# Patient Record
Sex: Female | Born: 1955 | Race: Black or African American | Hispanic: No | Smoking: Never smoker
Health system: Southern US, Community
[De-identification: ages and names within clinical notes are randomized; demographics above are authoritative.]

## PROBLEM LIST (undated history)

## (undated) DIAGNOSIS — K859 Acute pancreatitis without necrosis or infection, unspecified: Secondary | ICD-10-CM

## (undated) DIAGNOSIS — I10 Essential (primary) hypertension: Secondary | ICD-10-CM

## (undated) DIAGNOSIS — K8020 Calculus of gallbladder without cholecystitis without obstruction: Secondary | ICD-10-CM

## (undated) DIAGNOSIS — M1990 Unspecified osteoarthritis, unspecified site: Secondary | ICD-10-CM

## (undated) DIAGNOSIS — I519 Heart disease, unspecified: Secondary | ICD-10-CM

## (undated) HISTORY — DX: Calculus of gallbladder without cholecystitis without obstruction: K80.20

## (undated) HISTORY — PX: JOINT REPLACEMENT: SHX530

## (undated) HISTORY — DX: Acute pancreatitis without necrosis or infection, unspecified: K85.90

## (undated) HISTORY — PX: TONSILLECTOMY: SUR1361

## (undated) HISTORY — PX: ABDOMINAL HYSTERECTOMY: SHX81

## (undated) HISTORY — PX: OTHER SURGICAL HISTORY: SHX169

## (undated) HISTORY — PX: CARDIAC CATHETERIZATION: SHX172

---

## 1997-08-03 HISTORY — PX: GASTROPLASTY VERTICAL BANDED: SUR640

## 2004-06-24 ENCOUNTER — Ambulatory Visit: Payer: Self-pay

## 2004-10-15 ENCOUNTER — Ambulatory Visit: Payer: Self-pay | Admitting: Unknown Physician Specialty

## 2004-10-23 ENCOUNTER — Ambulatory Visit: Payer: Self-pay | Admitting: Unknown Physician Specialty

## 2005-05-05 ENCOUNTER — Ambulatory Visit: Payer: Self-pay | Admitting: Unknown Physician Specialty

## 2006-07-07 ENCOUNTER — Ambulatory Visit: Payer: Self-pay | Admitting: Internal Medicine

## 2006-07-28 ENCOUNTER — Ambulatory Visit: Payer: Self-pay | Admitting: Unknown Physician Specialty

## 2006-09-07 ENCOUNTER — Other Ambulatory Visit: Payer: Self-pay

## 2006-09-08 ENCOUNTER — Inpatient Hospital Stay: Payer: Self-pay | Admitting: Internal Medicine

## 2007-01-12 ENCOUNTER — Ambulatory Visit: Payer: Self-pay | Admitting: Unknown Physician Specialty

## 2007-02-07 ENCOUNTER — Ambulatory Visit: Payer: Self-pay | Admitting: Unknown Physician Specialty

## 2007-07-22 IMAGING — CT CT NECK WITH CONTRAST
1 of 2 series · 9 of 14 positions shown, 12 images · IV contrast (agent unspecified)
Comparison: none

REASON FOR EXAM: Right submandibular gland mass, lymphadenitis
COMMENTS:

PROCEDURE:     CT  - CT NECK WITH CONTRAST  - July 28, 2006  [DATE]
RESULT:     IV contrast enhanced CT scan of neck is performed.

[Series 2: soft tissue · axial · 0.48mm/px · z∈[+204,+432]mm · 9 of 96 slices shown, 12 images]
[im 10/96  soft-tissue]
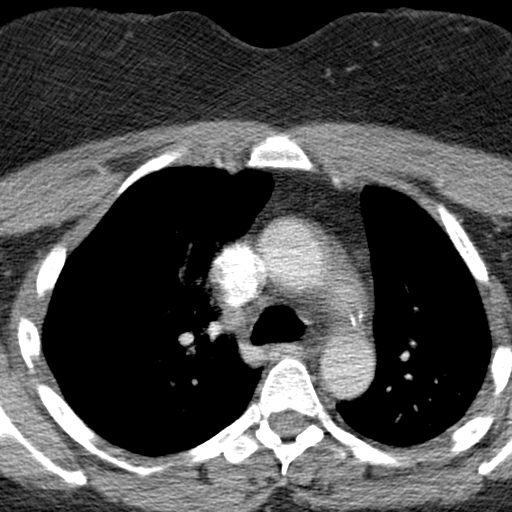
[im 10/96  bone]
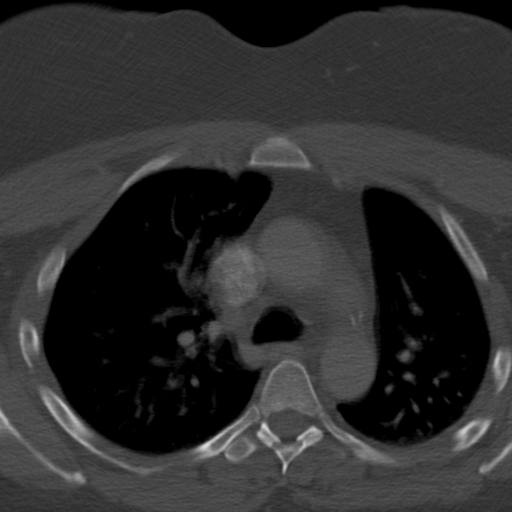
[im 20/96  bone]
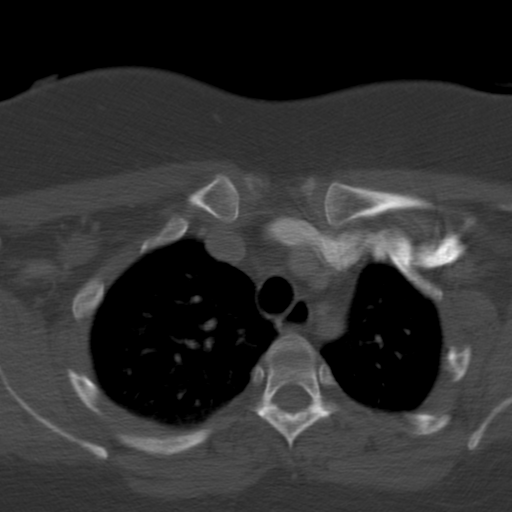
[im 29/96  bone]
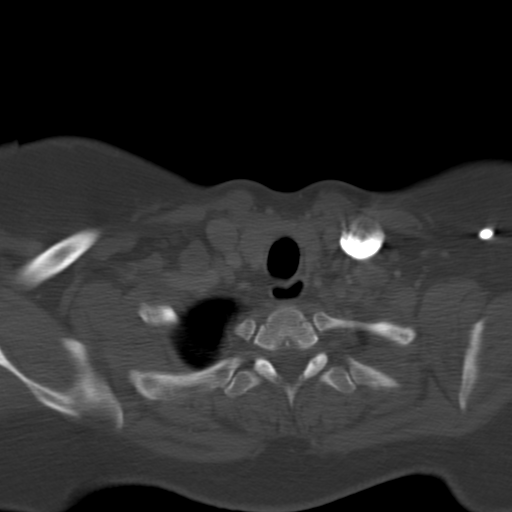
[im 39/96  bone]
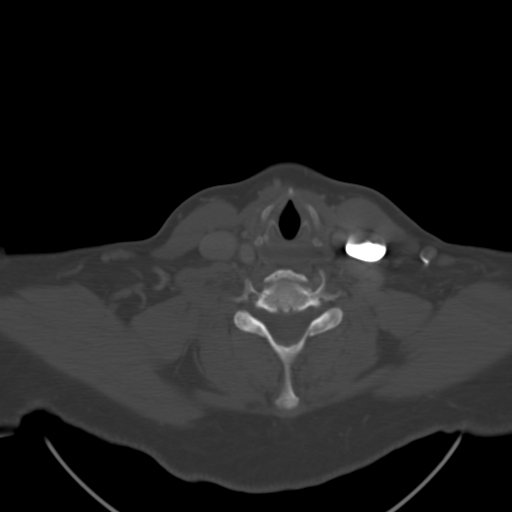
[im 48/96  soft-tissue]
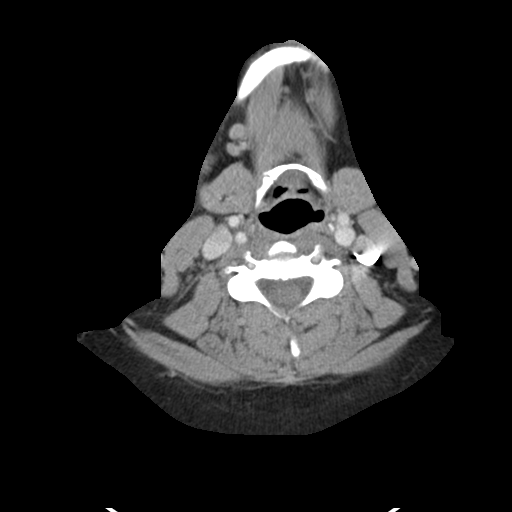
[im 48/96  bone]
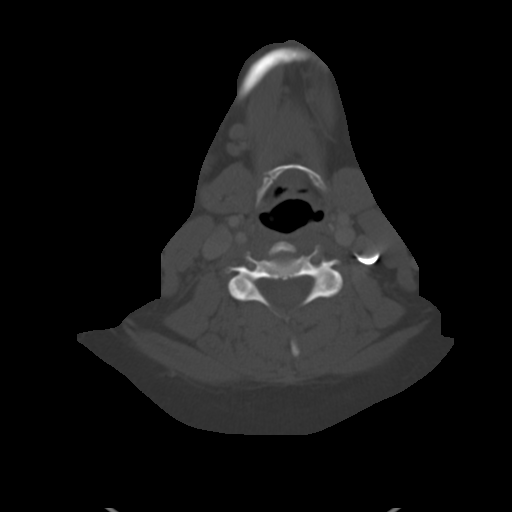
[im 58/96  bone]
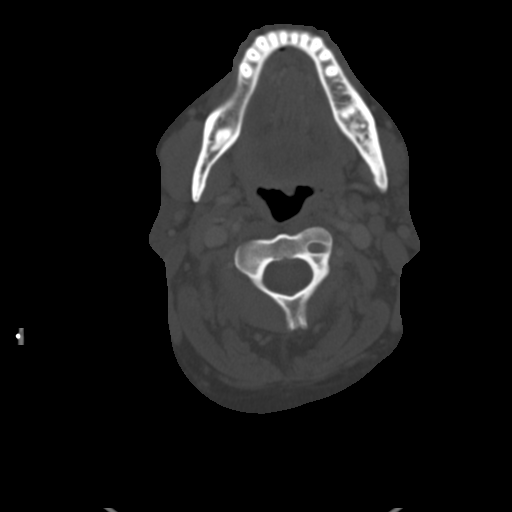
[im 67/96  bone]
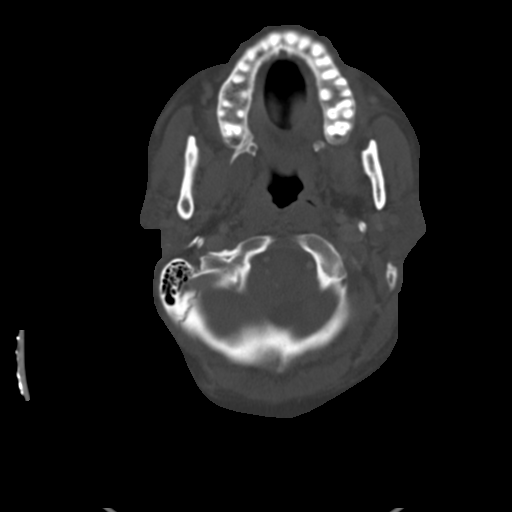
[im 77/96  bone]
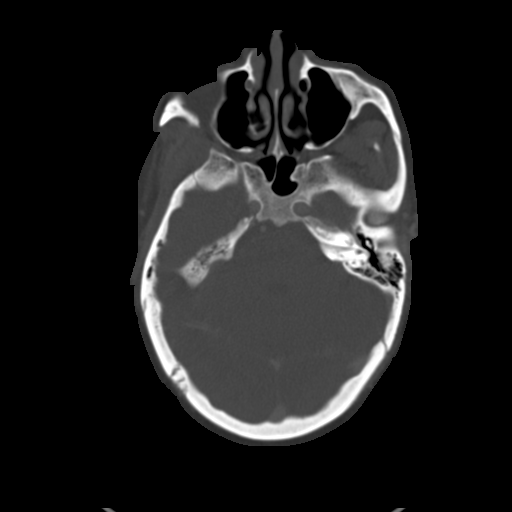
[im 86/96  soft-tissue]
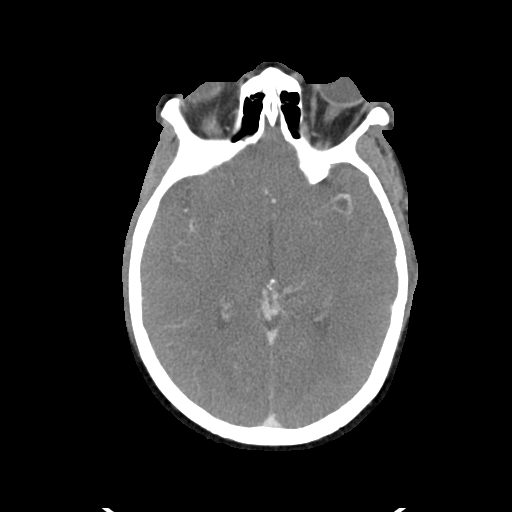
[im 86/96  bone]
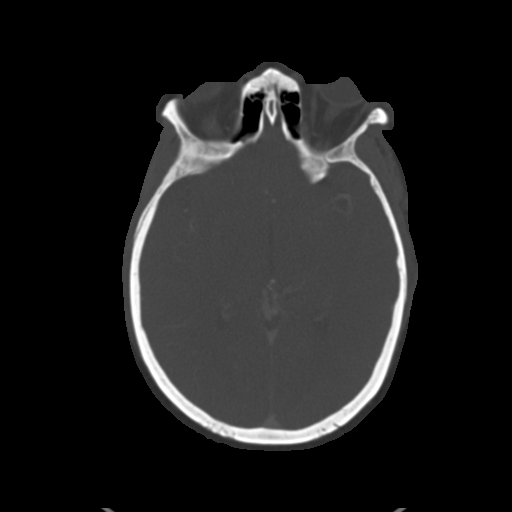

[9 of 14 positions shown; findings below may reference images not displayed]

FINDINGS: There is a cluster of lymph nodes anterior to the RIGHT
submandibular gland all measuring in the area of 10.0 to 11.0 mm in maximal
length. There is a slightly larger lymph node lateral to the RIGHT
submandibular gland on image #47 measuring 18.9 mm in length. There is a
cm, supraclavicular lymph node on the LEFT on image #72. The thyroid
enhances normally. The level of the true cords, tonsillar region,
parapharyngeal regions and base of the tongue regions show no definite mass.
Some additional 9.0 to 11.0 mm lymph nodes are seen in the jugular chains.
There is significant artifact from the patient's dental work. The parotid
glands appear to be within normal limits showing no definite mass. No
posterior cervical adenopathy or mass is seen. The submandibular glands
themselves appear to be symmetric and do not show a discrete mass.
IMPRESSION: No discrete submandibular gland mass is seen. There are some
enlarged lymph nodes especially in the LEFT supraclavicular fossa and
lateral to the RIGHT submandibular gland. Some roughly 10.0 to 11.0 mm lymph
nodes are seen anterior to the RIGHT submandibular gland and elsewhere in
the neck as described. The lung apices appear to show no discrete mass.

## 2007-07-23 ENCOUNTER — Ambulatory Visit: Payer: Self-pay | Admitting: Unknown Physician Specialty

## 2007-10-26 ENCOUNTER — Ambulatory Visit: Payer: Self-pay | Admitting: Internal Medicine

## 2008-12-13 ENCOUNTER — Ambulatory Visit: Payer: Self-pay | Admitting: Internal Medicine

## 2009-12-17 ENCOUNTER — Ambulatory Visit: Payer: Self-pay | Admitting: Internal Medicine

## 2010-12-23 ENCOUNTER — Ambulatory Visit: Payer: Self-pay | Admitting: Internal Medicine

## 2012-01-20 ENCOUNTER — Ambulatory Visit: Payer: Self-pay | Admitting: Internal Medicine

## 2012-02-16 ENCOUNTER — Ambulatory Visit: Payer: Self-pay | Admitting: Internal Medicine

## 2012-10-01 ENCOUNTER — Emergency Department: Payer: Self-pay | Admitting: Emergency Medicine

## 2012-10-01 LAB — CK TOTAL AND CKMB (NOT AT ARMC): CK, Total: 81 U/L (ref 21–215)

## 2012-10-01 LAB — COMPREHENSIVE METABOLIC PANEL
Albumin: 3.3 g/dL — ABNORMAL LOW (ref 3.4–5.0)
Alkaline Phosphatase: 135 U/L (ref 50–136)
Anion Gap: 5 — ABNORMAL LOW (ref 7–16)
BUN: 20 mg/dL — ABNORMAL HIGH (ref 7–18)
Bilirubin,Total: 0.3 mg/dL (ref 0.2–1.0)
Calcium, Total: 9.1 mg/dL (ref 8.5–10.1)
Co2: 30 mmol/L (ref 21–32)
Glucose: 101 mg/dL — ABNORMAL HIGH (ref 65–99)
Potassium: 3.7 mmol/L (ref 3.5–5.1)
SGPT (ALT): 37 U/L (ref 12–78)
Sodium: 142 mmol/L (ref 136–145)

## 2012-10-01 LAB — CBC WITH DIFFERENTIAL/PLATELET
Basophil #: 0.1 10*3/uL (ref 0.0–0.1)
Eosinophil #: 0.4 10*3/uL (ref 0.0–0.7)
Eosinophil %: 4.6 %
HGB: 12.1 g/dL (ref 12.0–16.0)
Lymphocyte #: 1.7 10*3/uL (ref 1.0–3.6)
Lymphocyte %: 20 %
MCHC: 33 g/dL (ref 32.0–36.0)
MCV: 82 fL (ref 80–100)
Neutrophil #: 5.2 10*3/uL (ref 1.4–6.5)
Neutrophil %: 62.7 %
Platelet: 191 10*3/uL (ref 150–440)
RDW: 14.8 % — ABNORMAL HIGH (ref 11.5–14.5)
WBC: 8.3 10*3/uL (ref 3.6–11.0)

## 2012-10-01 LAB — URINALYSIS, COMPLETE
Bacteria: NONE SEEN
Bilirubin,UR: NEGATIVE
Hyaline Cast: 1
Ketone: NEGATIVE
Nitrite: NEGATIVE
Ph: 5 (ref 4.5–8.0)
Protein: NEGATIVE
Squamous Epithelial: 3
WBC UR: 3 /HPF (ref 0–5)

## 2012-10-01 LAB — TROPONIN I: Troponin-I: <0.02 ng/mL

## 2013-02-08 ENCOUNTER — Ambulatory Visit: Payer: Self-pay | Admitting: Surgery

## 2013-02-16 ENCOUNTER — Other Ambulatory Visit: Payer: Self-pay | Admitting: Urgent Care

## 2013-02-16 LAB — CBC WITH DIFFERENTIAL/PLATELET
Basophil #: 0.1 10*3/uL (ref 0.0–0.1)
Basophil %: 1 %
Eosinophil #: 0.3 10*3/uL (ref 0.0–0.7)
Eosinophil %: 3.6 %
HGB: 12.9 g/dL (ref 12.0–16.0)
Lymphocyte #: 1.9 10*3/uL (ref 1.0–3.6)
Lymphocyte %: 23.7 %
MCH: 27 pg (ref 26.0–34.0)
MCHC: 33.3 g/dL (ref 32.0–36.0)
MCV: 81 fL (ref 80–100)
Monocyte #: 0.7 x10 3/mm (ref 0.2–0.9)
Neutrophil %: 63 %
RBC: 4.8 10*6/uL (ref 3.80–5.20)
RDW: 14.5 % (ref 11.5–14.5)

## 2013-02-16 LAB — HEPATIC FUNCTION PANEL A (ARMC)
Alkaline Phosphatase: 138 U/L — ABNORMAL HIGH (ref 50–136)
SGPT (ALT): 24 U/L (ref 12–78)
Total Protein: 8.1 g/dL (ref 6.4–8.2)

## 2013-02-16 LAB — LIPASE, BLOOD: Lipase: 169 U/L (ref 73–393)

## 2013-03-06 ENCOUNTER — Ambulatory Visit: Payer: Self-pay | Admitting: Gastroenterology

## 2013-06-07 ENCOUNTER — Ambulatory Visit: Payer: Self-pay | Admitting: Internal Medicine

## 2014-08-10 ENCOUNTER — Ambulatory Visit: Payer: Self-pay | Admitting: Internal Medicine

## 2015-04-11 ENCOUNTER — Other Ambulatory Visit: Payer: Self-pay | Admitting: Internal Medicine

## 2015-04-11 DIAGNOSIS — N644 Mastodynia: Secondary | ICD-10-CM

## 2015-04-18 ENCOUNTER — Ambulatory Visit
Admission: RE | Admit: 2015-04-18 | Discharge: 2015-04-18 | Disposition: A | Discharge status: Home / Self Care | Payer: Medicare Other | Source: Ambulatory Visit | Attending: Internal Medicine | Admitting: Internal Medicine

## 2015-04-18 DIAGNOSIS — N644 Mastodynia: Secondary | ICD-10-CM | POA: Diagnosis not present

## 2015-07-11 ENCOUNTER — Other Ambulatory Visit: Payer: Self-pay | Admitting: Internal Medicine

## 2015-07-11 DIAGNOSIS — Z1231 Encounter for screening mammogram for malignant neoplasm of breast: Secondary | ICD-10-CM

## 2015-08-13 ENCOUNTER — Other Ambulatory Visit: Payer: Self-pay | Admitting: Internal Medicine

## 2015-08-13 ENCOUNTER — Ambulatory Visit
Admission: RE | Admit: 2015-08-13 | Discharge: 2015-08-13 | Disposition: A | Discharge status: Home / Self Care | Payer: Medicare Other | Source: Ambulatory Visit | Attending: Internal Medicine | Admitting: Internal Medicine

## 2015-08-13 DIAGNOSIS — Z1231 Encounter for screening mammogram for malignant neoplasm of breast: Secondary | ICD-10-CM

## 2016-03-14 ENCOUNTER — Emergency Department
Admission: EM | Admit: 2016-03-14 | Discharge: 2016-03-14 | Disposition: A | Discharge status: Home / Self Care | Payer: Medicare Other | Attending: Emergency Medicine | Admitting: Emergency Medicine

## 2016-03-14 ENCOUNTER — Encounter: Payer: Self-pay | Admitting: Emergency Medicine

## 2016-03-14 ENCOUNTER — Emergency Department: Payer: Medicare Other

## 2016-03-14 DIAGNOSIS — I10 Essential (primary) hypertension: Secondary | ICD-10-CM | POA: Diagnosis not present

## 2016-03-14 DIAGNOSIS — M25552 Pain in left hip: Secondary | ICD-10-CM | POA: Diagnosis not present

## 2016-03-14 HISTORY — DX: Essential (primary) hypertension: I10

## 2016-03-14 MED ORDER — CYCLOBENZAPRINE HCL 10 MG PO TABS: 10.0000 mg | ORAL_TABLET | Freq: Three times a day (TID) | ORAL | 0 refills | Status: DC | PRN

## 2016-03-14 MED ORDER — DIAZEPAM 5 MG PO TABS
5.0000 mg | ORAL_TABLET | Freq: Once | ORAL | Status: DC
  Filled 2016-03-14: qty 1

## 2016-03-14 MED ORDER — KETOROLAC TROMETHAMINE 60 MG/2ML IM SOLN
60.0000 mg | Freq: Once | INTRAMUSCULAR | Status: AC
  Administered 2016-03-14: 60 mg via INTRAMUSCULAR
  Filled 2016-03-14: qty 2

## 2016-03-14 MED ORDER — HYDROMORPHONE HCL 1 MG/ML IJ SOLN
1.0000 mg | Freq: Once | INTRAMUSCULAR | Status: AC
  Administered 2016-03-14: 1 mg via INTRAMUSCULAR
  Filled 2016-03-14: qty 1

## 2016-03-14 MED ORDER — NAPROXEN 500 MG PO TABS: 500.0000 mg | ORAL_TABLET | Freq: Two times a day (BID) | ORAL | 0 refills | Status: DC

## 2016-03-14 NOTE — ED Triage Notes (Signed)
Pt states she was walking and felt her left hip give out states she caught herself  On a table and did not fall. States she has had 2 hip replacement on the left in the past. Denies being able to walk since.

## 2016-03-14 NOTE — Discharge Instructions (Signed)
Use the walker for stabilization while ambulating. Call and schedule an appointment with Dr. Ernest PineHooten or Dr. Hyacinth MeekerMiller (who is on call tonight). Take the muscle relaxer and the anti inflammatory as prescribed. Return to the ER for symptoms that change or worsen for symptoms that change or worsen if unable to schedule an appointment.

## 2016-03-14 NOTE — ED Provider Notes (Signed)
[facility] Emergency Department Provider Note ____________________________________________  Time seen: Approximately 6:41 PM  I have reviewed the triage vital signs and the nursing notes.   HISTORY  Chief Complaint No chief complaint on file.    HPI Katie Leon is a 60 y.o. female who presents to the emergency department for evaluation of left hip pain. She has had 2 previous left hip replacements, with the last being 10 years ago. She denies recent injury or injury today. She states she was "just walking and suddenly couldn't." She states that she began to fall forward, but was near a table and was able to hold on to it and just stand without bearing weight on the left leg. Pain is in the left hip with radiation around the groin and down the lateral aspect of the left leg. She denies loss of bowel or bladder control.  Past Medical History:  Diagnosis Date  . Hypertension     There are no active problems to display for this patient.   Past Surgical History:  Procedure Laterality Date  . ABDOMINAL HYSTERECTOMY    . Bilateral knee replacements     . GASTROPLASTY VERTICAL BANDED  1999  . Hip replacement x2  Left   . Shouulder surgery       Prior to Admission medications   Medication Sig Start Date End Date Taking? Authorizing Provider  cyclobenzaprine (FLEXERIL) 10 MG tablet Take 1 tablet (10 mg total) by mouth 3 (three) times daily as needed for muscle spasms. 03/14/16   Chinita Pesterari B Evalee Gerard, FNP  naproxen (NAPROSYN) 500 MG tablet Take 1 tablet (500 mg total) by mouth 2 (two) times daily with a meal. 03/14/16   Chinita Pesterari B Yaqub Arney, FNP    Allergies Review of patient's allergies indicates no known allergies.  Family History  Problem Relation Age of Onset  . Breast cancer Neg Hx     Social History Social History  Substance Use Topics  . Smoking status: Never Smoker  . Smokeless tobacco: Never Used  . Alcohol use No    Review of  Systems Constitutional: No recent illness. Cardiovascular: Denies chest pain or palpitations. Respiratory: Denies shortness of breath. Musculoskeletal: Pain in left hip. Skin: Negative for rash, wound, lesion. Neurological: Negative for focal weakness or numbness.  ____________________________________________   PHYSICAL EXAM:  VITAL SIGNS: ED Triage Vitals  Enc Vitals Group     BP 03/14/16 1814 (!) 144/77     Pulse Rate 03/14/16 1814 61     Resp 03/14/16 1814 18     Temp 03/14/16 1814 98.2 F (36.8 C)     Temp Source 03/14/16 1814 Oral     SpO2 03/14/16 1814 98 %     Weight 03/14/16 1815 250 lb (113.4 kg)     Height 03/14/16 1815 5\' 3"  (1.6 m)     Head Circumference --      Peak Flow --      Pain Score 03/14/16 1817 10     Pain Loc --      Pain Edu? --      Excl. in [place]? --     Constitutional: Alert and oriented. Well appearing and in no acute distress. Eyes: Conjunctivae are normal. EOMI. Head: Atraumatic. Neck: No stridor.  Respiratory: Normal respiratory effort.   Musculoskeletal: Pain with any movement of the left hip, making the assessment nearly impossible. Neurologic:  Normal speech and language. No gross focal neurologic deficits are appreciated. Speech is normal. No gait instability.  Skin:  Skin is warm, dry and intact. Atraumatic. Psychiatric: Mood and affect are normal. Speech and behavior are normal.  ____________________________________________   LABS (all labs ordered are listed, but only abnormal results are displayed)  Labs Reviewed - No data to display ____________________________________________  RADIOLOGY  CT left hip negative for acute bony abnormality per radiology. ____________________________________________   PROCEDURES  Procedure(s) performed: None   ____________________________________________   INITIAL IMPRESSION / ASSESSMENT AND PLAN / ED COURSE  Pertinent labs & imaging results that were available during my care of the  patient were reviewed by me and considered in my medical decision making (see chart for details).  IM Dilaudid ordered as she will likely be unable to tolerate movement and lying on the scanner for CT.   Patient was given IM Toradol with some relief. She refused the by mouth Valium because she was afraid was going to make her too sleepy. She was able to get into the wheelchair with minimal assistance to go to the bathroom. She will be prescribed a walker to use at home. She was instructed to follow-up with orthopedics. She'll be given prescriptions for Flexeril and Naprosyn. She was instructed to return to the emergency department for symptoms that change or worsen if she is unable to see the orthopedist. ____________________________________________   FINAL CLINICAL IMPRESSION(S) / ED DIAGNOSES  Final diagnoses:  Hip pain, acute, left       Chinita Pester, FNP 03/14/16 2140    Emily Filbert, MD 03/14/16 2259

## 2016-09-03 ENCOUNTER — Other Ambulatory Visit: Payer: Self-pay | Admitting: Internal Medicine

## 2016-09-03 DIAGNOSIS — Z1231 Encounter for screening mammogram for malignant neoplasm of breast: Secondary | ICD-10-CM

## 2016-10-13 ENCOUNTER — Ambulatory Visit: Payer: Medicare Other

## 2016-11-10 ENCOUNTER — Ambulatory Visit
Admission: RE | Admit: 2016-11-10 | Discharge: 2016-11-10 | Disposition: A | Discharge status: Home / Self Care | Payer: Medicare Other | Source: Ambulatory Visit | Attending: Internal Medicine | Admitting: Internal Medicine

## 2016-11-10 ENCOUNTER — Encounter: Payer: Self-pay | Admitting: Radiology

## 2016-11-10 DIAGNOSIS — Z1231 Encounter for screening mammogram for malignant neoplasm of breast: Secondary | ICD-10-CM | POA: Diagnosis not present

## 2017-03-28 ENCOUNTER — Encounter: Payer: Self-pay | Admitting: Emergency Medicine

## 2017-03-28 ENCOUNTER — Emergency Department: Payer: Medicare Other

## 2017-03-28 ENCOUNTER — Inpatient Hospital Stay
Admission: EM | Admit: 2017-03-28 | Discharge: 2017-03-30 | DRG: 439 | Disposition: A | Discharge status: Home / Self Care | Payer: Medicare Other | Attending: Specialist | Admitting: Specialist

## 2017-03-28 DIAGNOSIS — Z833 Family history of diabetes mellitus: Secondary | ICD-10-CM

## 2017-03-28 DIAGNOSIS — Z96642 Presence of left artificial hip joint: Secondary | ICD-10-CM | POA: Diagnosis present

## 2017-03-28 DIAGNOSIS — Z90710 Acquired absence of both cervix and uterus: Secondary | ICD-10-CM

## 2017-03-28 DIAGNOSIS — K8020 Calculus of gallbladder without cholecystitis without obstruction: Secondary | ICD-10-CM | POA: Diagnosis present

## 2017-03-28 DIAGNOSIS — I10 Essential (primary) hypertension: Secondary | ICD-10-CM | POA: Diagnosis present

## 2017-03-28 DIAGNOSIS — K851 Biliary acute pancreatitis without necrosis or infection: Principal | ICD-10-CM | POA: Diagnosis present

## 2017-03-28 DIAGNOSIS — Z96653 Presence of artificial knee joint, bilateral: Secondary | ICD-10-CM | POA: Diagnosis present

## 2017-03-28 DIAGNOSIS — R109 Unspecified abdominal pain: Secondary | ICD-10-CM

## 2017-03-28 DIAGNOSIS — K859 Acute pancreatitis without necrosis or infection, unspecified: Secondary | ICD-10-CM

## 2017-03-28 DIAGNOSIS — E876 Hypokalemia: Secondary | ICD-10-CM | POA: Diagnosis present

## 2017-03-28 DIAGNOSIS — Z6841 Body Mass Index (BMI) 40.0 and over, adult: Secondary | ICD-10-CM

## 2017-03-28 DIAGNOSIS — E669 Obesity, unspecified: Secondary | ICD-10-CM | POA: Diagnosis present

## 2017-03-28 HISTORY — DX: Heart disease, unspecified: I51.9

## 2017-03-28 LAB — BASIC METABOLIC PANEL
Anion gap: 9 (ref 5–15)
BUN: 27 mg/dL — AB (ref 6–20)
CHLORIDE: 104 mmol/L (ref 101–111)
CO2: 26 mmol/L (ref 22–32)
CREATININE: 0.93 mg/dL (ref 0.44–1.00)
Calcium: 9.3 mg/dL (ref 8.9–10.3)
GFR calc Af Amer: >60 mL/min (ref >60)
GFR calc non Af Amer: >60 mL/min (ref >60)
GLUCOSE: 101 mg/dL — AB (ref 65–99)
Potassium: 3.2 mmol/L — ABNORMAL LOW (ref 3.5–5.1)
Sodium: 139 mmol/L (ref 135–145)

## 2017-03-28 LAB — CBC
HCT: 37.8 % (ref 35.0–47.0)
Hemoglobin: 12.9 g/dL (ref 12.0–16.0)
MCH: 27.8 pg (ref 26.0–34.0)
MCHC: 34.1 g/dL (ref 32.0–36.0)
MCV: 81.6 fL (ref 80.0–100.0)
PLATELETS: 197 10*3/uL (ref 150–440)
RBC: 4.64 MIL/uL (ref 3.80–5.20)
RDW: 15.1 % — ABNORMAL HIGH (ref 11.5–14.5)
WBC: 10.6 10*3/uL (ref 3.6–11.0)

## 2017-03-28 LAB — HEPATIC FUNCTION PANEL
ALT: 49 U/L (ref 14–54)
AST: 152 U/L — AB (ref 15–41)
Albumin: 3.9 g/dL (ref 3.5–5.0)
Alkaline Phosphatase: 300 U/L — ABNORMAL HIGH (ref 38–126)
BILIRUBIN DIRECT: 0.2 mg/dL (ref 0.1–0.5)
BILIRUBIN INDIRECT: 0.5 mg/dL (ref 0.3–0.9)
BILIRUBIN TOTAL: 0.7 mg/dL (ref 0.3–1.2)
Total Protein: 7.2 g/dL (ref 6.5–8.1)

## 2017-03-28 LAB — TROPONIN I: Troponin I: <0.03 ng/mL (ref <0.03)

## 2017-03-28 LAB — BRAIN NATRIURETIC PEPTIDE: B NATRIURETIC PEPTIDE 5: 34 pg/mL (ref 0.0–100.0)

## 2017-03-28 LAB — LIPASE, BLOOD: Lipase: 588 U/L — ABNORMAL HIGH (ref 11–51)

## 2017-03-28 MED ORDER — OXYCODONE HCL 5 MG PO TABS
5.0000 mg | ORAL_TABLET | Freq: Once | ORAL | Status: AC
  Administered 2017-03-29: 5 mg via ORAL
  Filled 2017-03-28: qty 1

## 2017-03-28 MED ORDER — ACETAMINOPHEN 500 MG PO TABS
1000.0000 mg | ORAL_TABLET | Freq: Once | ORAL | Status: AC
  Administered 2017-03-28: 1000 mg via ORAL
  Filled 2017-03-28: qty 2

## 2017-03-28 MED ORDER — MORPHINE SULFATE (PF) 4 MG/ML IV SOLN
4.0000 mg | Freq: Once | INTRAVENOUS | Status: AC
  Administered 2017-03-28: 4 mg via INTRAVENOUS
  Filled 2017-03-28: qty 1

## 2017-03-28 MED ORDER — ASPIRIN 81 MG PO CHEW
324.0000 mg | CHEWABLE_TABLET | Freq: Once | ORAL | Status: AC
  Administered 2017-03-28: 324 mg via ORAL
  Filled 2017-03-28: qty 4

## 2017-03-28 MED ORDER — IOPAMIDOL (ISOVUE-370) INJECTION 76%
75.0000 mL | Freq: Once | INTRAVENOUS | Status: AC | PRN
  Administered 2017-03-28: 135 mL via INTRAVENOUS

## 2017-03-28 MED ORDER — OXYCODONE HCL 5 MG PO TABS
5.0000 mg | ORAL_TABLET | Freq: Once | ORAL | Status: AC
  Administered 2017-03-28: 5 mg via ORAL
  Filled 2017-03-28: qty 1

## 2017-03-28 NOTE — ED Notes (Signed)
Pt back from US

## 2017-03-28 NOTE — ED Provider Notes (Signed)
The patient's lipase is back at 588. Right upper quadrant ultrasound shows gallstones but no signs of biliary dilatation.  The patient is well-appearing with a benign abdomen and is able to eat and drink.  Will PO challenge now but anticipate discharge.   Merrily Brittle, MD 03/28/17 2348

## 2017-03-28 NOTE — ED Provider Notes (Addendum)
[facility] Emergency Department Provider Note  ____________________________________________  Time seen: Approximately 9:11 PM  I have reviewed the triage vital signs and the nursing notes.   HISTORY  Chief Complaint Chest Pain   HPI Katie Leon is a 61 y.o. female with a history of hypertension who presents for evaluation of chest pain. Patient reports 3 days ago she was stuck in an elevator for an hour. She denies feeling anxious or have a history of anxiety. When she was finally rescued she had to slide out of the elevator into stairs since the elevator was stuck between 2 floors. She did feel anxious while doing that. As soon as she got out of the elevator she felt dizzy and since then she has been having chest pain. She describes that the chest pain as constant, stabbing, located in the center of her chest, radiating to the upper back, severe, associatedwith mild SOB. Patient denies ever having similar pain. She denies numbness or paresthesias of her extremities. She denies personal history of blood clots however her daughter had a DVT in the past. No hemoptysis, no cough, no fever or chills, no recent travel immobilization, no leg pain or swelling, no exogenous hormones. No history of smoking. No personal or family history of heart attacks.  Past Medical History:  Diagnosis Date  . Heart disease   . Hypertension     Patient Active Problem List   Diagnosis Date Noted  . Pancreatitis 03/29/2017  . Gallstones 03/29/2017  . HTN (hypertension) 03/29/2017    Past Surgical History:  Procedure Laterality Date  . ABDOMINAL HYSTERECTOMY    . Bilateral knee replacements     . CARDIAC CATHETERIZATION    . GASTROPLASTY VERTICAL BANDED  1999  . Hip replacement x2  Left   . Shouulder surgery       Prior to Admission medications   Medication Sig Start Date End Date Taking? Authorizing Provider  diltiazem (CARDIZEM CD) 300 MG 24 hr capsule Take 300 mg by  mouth daily. 02/22/17  Yes [provider]  meloxicam (MOBIC) 15 MG tablet Take 15 mg by mouth daily. 03/23/17  Yes [provider]  triamterene-hydrochlorothiazide (MAXZIDE-25) 37.5-25 MG tablet Take 1 tablet by mouth daily. 03/23/17  Yes [provider]    Allergies Patient has no known allergies.  Family History  Problem Relation Age of Onset  . Diabetes Father   . Diabetes Brother   . Breast cancer Neg Hx     Social History Social History  Substance Use Topics  . Smoking status: Never Smoker  . Smokeless tobacco: Never Used  . Alcohol use No    Review of Systems  Constitutional: Negative for fever. Eyes: Negative for visual changes. ENT: Negative for sore throat. Neck: No neck pain  Cardiovascular: + chest pain. Respiratory: +shortness of breath. Gastrointestinal: Negative for abdominal pain, vomiting or diarrhea. Genitourinary: Negative for dysuria. Musculoskeletal: Negative for back pain. Skin: Negative for rash. Neurological: Negative for headaches, weakness or numbness. Psych: No SI or HI  ____________________________________________   PHYSICAL EXAM:  VITAL SIGNS: ED Triage Vitals  Enc Vitals Group     BP 03/28/17 1954 (!) 170/99     Pulse Rate 03/28/17 1954 71     Resp 03/28/17 1954 18     Temp 03/28/17 1954 98.2 F (36.8 C)     Temp Source 03/28/17 1954 Oral     SpO2 03/28/17 1954 98 %     Weight 03/28/17 1949 286  lb (129.7 kg)     Height 03/28/17 1949 5' 3"  (1.6 m)     Head Circumference --      Peak Flow --      Pain Score 03/28/17 1949 10     Pain Loc --      Pain Edu? --      Excl. in [place]? --     Constitutional: Alert and oriented. Well appearing and in no apparent distress. HEENT:      Head: Normocephalic and atraumatic.         Eyes: Conjunctivae are normal. Sclera is non-icteric.       Mouth/Throat: Mucous membranes are moist.       Neck: Supple with no signs of meningismus. Cardiovascular: Regular rate and  rhythm. No murmurs, gallops, or rubs. 2+ symmetrical distal pulses are present in all extremities. No JVD. Patient is tender to palpation over the sternun region with reproduction of the pain Respiratory: Normal respiratory effort. Lungs are clear to auscultation bilaterally. No wheezes, crackles, or rhonchi.  Gastrointestinal: Soft, non tender, and non distended with positive bowel sounds. No rebound or guarding. Musculoskeletal: Nontender with normal range of motion in all extremities. No edema, cyanosis, or erythema of extremities. Neurologic: Normal speech and language. Face is symmetric. Moving all extremities. No gross focal neurologic deficits are appreciated. Skin: Skin is warm, dry and intact. No rash noted. Psychiatric: Mood and affect are normal. Speech and behavior are normal.  ____________________________________________   LABS (all labs ordered are listed, but only abnormal results are displayed)  Labs Reviewed  BASIC METABOLIC PANEL - Abnormal; Notable for the following:       Result Value   Potassium 3.2 (*)    Glucose, Bld 101 (*)    BUN 27 (*)    All other components within normal limits  CBC - Abnormal; Notable for the following:    RDW 15.1 (*)    All other components within normal limits  HEPATIC FUNCTION PANEL - Abnormal; Notable for the following:    AST 152 (*)    Alkaline Phosphatase 300 (*)    All other components within normal limits  LIPASE, BLOOD - Abnormal; Notable for the following:    Lipase 588 (*)    All other components within normal limits  BASIC METABOLIC PANEL - Abnormal; Notable for the following:    Potassium 3.0 (*)    Glucose, Bld 117 (*)    Calcium 8.8 (*)    All other components within normal limits  CBC - Abnormal; Notable for the following:    Hemoglobin 11.9 (*)    RDW 14.8 (*)    All other components within normal limits  LIPASE, BLOOD - Abnormal; Notable for the following:    Lipase 85 (*)    All other components within normal  limits  HEPATIC FUNCTION PANEL - Abnormal; Notable for the following:    AST 81 (*)    Alkaline Phosphatase 266 (*)    Bilirubin, Direct <0.1 (*)    All other components within normal limits  TROPONIN I  TROPONIN I  BRAIN NATRIURETIC PEPTIDE  HIV ANTIBODY (ROUTINE TESTING)  COMPREHENSIVE METABOLIC PANEL  LIPASE, BLOOD   ____________________________________________  EKG  ED ECG REPORT I, Rudene Re, the attending physician, personally viewed and interpreted this ECG.  Normal sinus rhythm, rate of 72, normal intervals, LVH, normal axis, no ST elevations or depressions, diffuse T-wave flattening. No significant changes when compared to prior from 2014 ____________________________________________  RADIOLOGY  CTA chest: 1. Negative for acute pulmonary embolus. Grossly clear lung fields 2. Partially visualized dilated gallbladder  ____________________________________________   PROCEDURES  Procedure(s) performed: None Procedures Critical Care performed:  None ____________________________________________   INITIAL IMPRESSION / ASSESSMENT AND PLAN / ED COURSE   61 y.o. female with a history of hypertension who presents for evaluation of pleuritic stabbing central chest pain and SOB x 3 days. EKG with no evidence of ischemia. Pain is reproducible on palpation which could be due to musculoskeletal symptoms however patient didn't look diaphoretic in triage upon arrival. First troponin is negative. Chest x-ray with no acute findings. We'll send patient for CT angiogram of the chest to eval for PE or dissection. Will cycle troponins. Will give ASA, tylenol, and oxycodone.  ----------------------------------------- 8:19 PM on 03/28/2017 -----------------------------------------   OBSERVATION CARE: This patient is being placed under observation care for the following reasons: Chest pain with repeat testing to rule out ischemia   Clinical Course as of Mar 29 1802  Sun Mar 28, 2017  2215 CT negative for dissection or PE. The CT showed a large gallbladder. Pain is reproducible with palpation in the epigastrium. I have added on lipase and LFTs and I will send patient for right upper quadrant ultrasound.  [CV]  2316 AST 152 and alk phosph 300, normal Tbili. RUQ [place] pending. 2nd troponin pending. Care transferred to Dr. Mable Paris  [CV]    Clinical Course User Index [CV] Alfred Levins, [place], MD     ----------------------------------------- 12:43 AM on 03/29/2017 -----------------------------------------   END OF OBSERVATION STATUS: After an appropriate period of observation, this patient is being admitted due to the following reason(s):  Pancreatitis and inability to tolerate PO  Pertinent labs & imaging results that were available during my care of the patient were reviewed by me and considered in my medical decision making (see chart for details).    ____________________________________________   FINAL CLINICAL IMPRESSION(S) / ED DIAGNOSES  Final diagnoses:  Abdominal pain  Acute pancreatitis, unspecified complication status, unspecified pancreatitis type      NEW MEDICATIONS STARTED DURING THIS VISIT:  Current Discharge Medication List       Note:  This document was prepared using Dragon voice recognition software and may include unintentional dictation errors.    Alfred Levins, [place], MD 03/28/17 [place], [place], MD 03/29/17 [phone]

## 2017-03-28 NOTE — ED Notes (Signed)
Pt in CT.

## 2017-03-28 NOTE — ED Notes (Signed)
Pt up to toilet 

## 2017-03-28 NOTE — ED Triage Notes (Signed)
Pt arrives ambulatory to triage with c/o CP. Pt reports that she was stuck in an elevator Thursday and has had on and off chest pain since that time. Pt states that she feels a stabbing pain in her back as well as mid sternum. Pt is diaphoretic in triage at this time.

## 2017-03-29 ENCOUNTER — Encounter: Payer: Self-pay | Admitting: Internal Medicine

## 2017-03-29 DIAGNOSIS — K859 Acute pancreatitis without necrosis or infection, unspecified: Secondary | ICD-10-CM | POA: Diagnosis not present

## 2017-03-29 DIAGNOSIS — Z96642 Presence of left artificial hip joint: Secondary | ICD-10-CM | POA: Diagnosis present

## 2017-03-29 DIAGNOSIS — K851 Biliary acute pancreatitis without necrosis or infection: Principal | ICD-10-CM | POA: Diagnosis present

## 2017-03-29 DIAGNOSIS — K8020 Calculus of gallbladder without cholecystitis without obstruction: Secondary | ICD-10-CM

## 2017-03-29 DIAGNOSIS — I10 Essential (primary) hypertension: Secondary | ICD-10-CM | POA: Diagnosis present

## 2017-03-29 DIAGNOSIS — R109 Unspecified abdominal pain: Secondary | ICD-10-CM | POA: Diagnosis present

## 2017-03-29 DIAGNOSIS — Z6841 Body Mass Index (BMI) 40.0 and over, adult: Secondary | ICD-10-CM | POA: Diagnosis not present

## 2017-03-29 DIAGNOSIS — E876 Hypokalemia: Secondary | ICD-10-CM | POA: Diagnosis present

## 2017-03-29 DIAGNOSIS — Z90710 Acquired absence of both cervix and uterus: Secondary | ICD-10-CM | POA: Diagnosis not present

## 2017-03-29 DIAGNOSIS — Z833 Family history of diabetes mellitus: Secondary | ICD-10-CM | POA: Diagnosis not present

## 2017-03-29 DIAGNOSIS — E669 Obesity, unspecified: Secondary | ICD-10-CM | POA: Diagnosis present

## 2017-03-29 DIAGNOSIS — Z96653 Presence of artificial knee joint, bilateral: Secondary | ICD-10-CM | POA: Diagnosis present

## 2017-03-29 LAB — CBC
HCT: 35 % (ref 35.0–47.0)
Hemoglobin: 11.9 g/dL — ABNORMAL LOW (ref 12.0–16.0)
MCH: 27.9 pg (ref 26.0–34.0)
MCHC: 33.9 g/dL (ref 32.0–36.0)
MCV: 82.3 fL (ref 80.0–100.0)
PLATELETS: 181 10*3/uL (ref 150–440)
RBC: 4.26 MIL/uL (ref 3.80–5.20)
RDW: 14.8 % — AB (ref 11.5–14.5)
WBC: 7.2 10*3/uL (ref 3.6–11.0)

## 2017-03-29 LAB — HEPATIC FUNCTION PANEL
ALBUMIN: 3.5 g/dL (ref 3.5–5.0)
ALT: 45 U/L (ref 14–54)
AST: 81 U/L — ABNORMAL HIGH (ref 15–41)
Alkaline Phosphatase: 266 U/L — ABNORMAL HIGH (ref 38–126)
BILIRUBIN TOTAL: 0.6 mg/dL (ref 0.3–1.2)
Bilirubin, Direct: <0.1 mg/dL — ABNORMAL LOW (ref 0.1–0.5)
TOTAL PROTEIN: 6.7 g/dL (ref 6.5–8.1)

## 2017-03-29 LAB — BASIC METABOLIC PANEL
Anion gap: 7 (ref 5–15)
BUN: 20 mg/dL (ref 6–20)
CO2: 26 mmol/L (ref 22–32)
CREATININE: 0.48 mg/dL (ref 0.44–1.00)
Calcium: 8.8 mg/dL — ABNORMAL LOW (ref 8.9–10.3)
Chloride: 105 mmol/L (ref 101–111)
Glucose, Bld: 117 mg/dL — ABNORMAL HIGH (ref 65–99)
Potassium: 3 mmol/L — ABNORMAL LOW (ref 3.5–5.1)
SODIUM: 138 mmol/L (ref 135–145)

## 2017-03-29 LAB — LIPASE, BLOOD: LIPASE: 85 U/L — AB (ref 11–51)

## 2017-03-29 LAB — TROPONIN I: Troponin I: <0.03 ng/mL (ref <0.03)

## 2017-03-29 MED ORDER — SODIUM CHLORIDE 0.9 % IV SOLN
INTRAVENOUS | Status: AC
  Administered 2017-03-29: 03:00:00 via INTRAVENOUS

## 2017-03-29 MED ORDER — SODIUM CHLORIDE 0.9 % IV SOLN
INTRAVENOUS | Status: AC
  Administered 2017-03-29: 14:00:00 via INTRAVENOUS

## 2017-03-29 MED ORDER — TRIAMTERENE-HCTZ 37.5-25 MG PO TABS: 1.0000 | ORAL_TABLET | Freq: Every day | ORAL | Status: DC

## 2017-03-29 MED ORDER — ENOXAPARIN SODIUM 40 MG/0.4ML ~~LOC~~ SOLN
40.0000 mg | Freq: Two times a day (BID) | SUBCUTANEOUS | Status: DC
  Administered 2017-03-29 – 2017-03-30 (×3): 40 mg via SUBCUTANEOUS
  Filled 2017-03-29 (×3): qty 0.4

## 2017-03-29 MED ORDER — ACETAMINOPHEN 325 MG PO TABS
650.0000 mg | ORAL_TABLET | Freq: Four times a day (QID) | ORAL | Status: DC | PRN
  Administered 2017-03-29: 650 mg via ORAL
  Filled 2017-03-29: qty 2

## 2017-03-29 MED ORDER — ONDANSETRON HCL 4 MG PO TABS: 4.0000 mg | ORAL_TABLET | Freq: Four times a day (QID) | ORAL | Status: DC | PRN

## 2017-03-29 MED ORDER — SODIUM CHLORIDE 0.9 % IV BOLUS (SEPSIS)
1000.0000 mL | Freq: Once | INTRAVENOUS | Status: AC
  Administered 2017-03-29: 1000 mL via INTRAVENOUS

## 2017-03-29 MED ORDER — ACETAMINOPHEN 650 MG RE SUPP: 650.0000 mg | Freq: Four times a day (QID) | RECTAL | Status: DC | PRN

## 2017-03-29 MED ORDER — ONDANSETRON HCL 4 MG/2ML IJ SOLN
4.0000 mg | Freq: Four times a day (QID) | INTRAMUSCULAR | Status: DC | PRN
  Administered 2017-03-29: 4 mg via INTRAVENOUS
  Filled 2017-03-29: qty 2

## 2017-03-29 MED ORDER — OXYCODONE HCL 5 MG PO TABS: 5.0000 mg | ORAL_TABLET | ORAL | Status: DC | PRN

## 2017-03-29 MED ORDER — PROMETHAZINE HCL 25 MG/ML IJ SOLN
12.5000 mg | Freq: Four times a day (QID) | INTRAMUSCULAR | Status: DC | PRN
  Filled 2017-03-29: qty 1

## 2017-03-29 MED ORDER — DILTIAZEM HCL ER COATED BEADS 300 MG PO CP24
300.0000 mg | ORAL_CAPSULE | Freq: Every day | ORAL | Status: DC
  Administered 2017-03-29 – 2017-03-30 (×2): 300 mg via ORAL
  Filled 2017-03-29 (×2): qty 1

## 2017-03-29 MED ORDER — MORPHINE SULFATE (PF) 4 MG/ML IV SOLN: 4.0000 mg | INTRAVENOUS | Status: DC | PRN

## 2017-03-29 MED ORDER — TRIAMTERENE-HCTZ 37.5-25 MG PO TABS
1.0000 | ORAL_TABLET | Freq: Every day | ORAL | Status: DC
  Administered 2017-03-29 – 2017-03-30 (×2): 1 via ORAL
  Filled 2017-03-29 (×2): qty 1

## 2017-03-29 MED ORDER — ONDANSETRON 4 MG PO TBDP
4.0000 mg | ORAL_TABLET | Freq: Once | ORAL | Status: AC
  Administered 2017-03-29: 4 mg via ORAL
  Filled 2017-03-29: qty 1

## 2017-03-29 NOTE — ED Notes (Signed)
Pt states water made her sick and she was unable to hold it down. MD notified

## 2017-03-29 NOTE — Consult Note (Signed)
Katie Minium, MD [facility]  [address] [place], [place] [address] Phone: [phone] Fax : [phone]  Consultation  Referring Provider:     No ref. provider found Primary Care Physician:  Derwood Kaplan, MD Primary Gastroenterologist:  Dr. Mechele Collin         Reason for Consultation:     Pancreatitis  Date of Admission:  03/28/2017 Date of Consultation:  03/29/2017         HPI:   Katie Leon is a 61 y.o. female who was admitted with what appears to be gallstone PEG retires with an ultrasound showing her to have stones in the gallbladder with sludge. The patient was seen by surgery who recommended the patient undergo a elective laparoscopic cholecystectomy as an outpatient. The patient's lipase has gone down and is improving. The patient reports that her pain is improved greatly since admission but she still has some of the pain. The patient is now taking a clear liquid diet. Now being asked to see the patient for gallstone pancreatitis.   Past Medical History:  Diagnosis Date  . Heart disease   . Hypertension     Past Surgical History:  Procedure Laterality Date  . ABDOMINAL HYSTERECTOMY    . Bilateral knee replacements     . CARDIAC CATHETERIZATION    . GASTROPLASTY VERTICAL BANDED  1999  . Hip replacement x2  Left   . Shouulder surgery       Prior to Admission medications   Medication Sig Start Date End Date Taking? Authorizing Provider  diltiazem (CARDIZEM CD) 300 MG 24 hr capsule Take 300 mg by mouth daily. 02/22/17  Yes [provider]  meloxicam (MOBIC) 15 MG tablet Take 15 mg by mouth daily. 03/23/17  Yes [provider]  triamterene-hydrochlorothiazide (MAXZIDE-25) 37.5-25 MG tablet Take 1 tablet by mouth daily. 03/23/17  Yes [provider]    Family History  Problem Relation Age of Onset  . Diabetes Father   . Diabetes Brother   . Breast cancer Neg Hx      Social History  Substance Use Topics  . Smoking status: Never  Smoker  . Smokeless tobacco: Never Used  . Alcohol use No    Allergies as of 03/28/2017  . (No Known Allergies)    Review of Systems:    All systems reviewed and negative except where noted in HPI.   Physical Exam:  Vital signs in last 24 hours: Temp:  [97.4 F (36.3 C)-98.2 F (36.8 C)] 97.9 F (36.6 C) (08/27 1239) Pulse Rate:  [46-117] 50 (08/27 1350) Resp:  [10-29] 18 (08/27 0456) BP: (117-175)/(62-99) 135/74 (08/27 1239) SpO2:  [88 %-100 %] 98 % (08/27 1350) Weight:  [275 lb (124.7 kg)-286 lb (129.7 kg)] 275 lb (124.7 kg) (08/27 0154) Last BM Date: 03/28/17 General:   Pleasant, cooperative in NAD Head:  Normocephalic and atraumatic. Eyes:   No icterus.   Conjunctiva pink. PERRLA. Ears:  Normal auditory acuity. Neck:  Supple; no masses or thyroidomegaly Lungs: Respirations even and unlabored. Lungs clear to auscultation bilaterally.   No wheezes, crackles, or rhonchi.  Heart:  Regular rate and rhythm;  Without murmur, clicks, rubs or gallops Abdomen:  Soft, nondistended, mild epigastric tenderness. Normal bowel sounds. No appreciable masses or hepatomegaly.  No rebound or guarding.  Rectal:  Not performed. Msk:  Symmetrical without gross deformities.    Extremities:  Without edema, cyanosis or clubbing. Neurologic:  Alert and oriented x3;  grossly normal neurologically.  Skin:  Intact without significant lesions or rashes. Cervical Nodes:  No significant cervical adenopathy. Psych:  Alert and cooperative. Normal affect.  LAB RESULTS:  Recent Labs  03/28/17 2002 03/29/17 0328  WBC 10.6 7.2  HGB 12.9 11.9*  HCT 37.8 35.0  PLT 197 181   BMET  Recent Labs  03/28/17 2002 03/29/17 0328  NA 139 138  K 3.2* 3.0*  CL 104 105  CO2 26 26  GLUCOSE 101* 117*  BUN 27* 20  CREATININE 0.93 0.48  CALCIUM 9.3 8.8*   LFT  Recent Labs  03/29/17 0328  PROT 6.7  ALBUMIN 3.5  AST 81*  ALT 45  ALKPHOS 266*  BILITOT 0.6  BILIDIR <0.1*  IBILI NOT CALCULATED    PT/INR No results for input(s): LABPROT, INR in the last 72 hours.  STUDIES: Dg Chest 2 View  Result Date: 03/28/2017 CLINICAL DATA:  Chest pain EXAM: CHEST  2 VIEW COMPARISON:  10/01/2012 FINDINGS: The heart size and mediastinal contours are within normal limits. Both lungs are clear. The visualized skeletal structures are unremarkable. IMPRESSION: No active cardiopulmonary disease. Electronically Signed   By: Alcide Clever M.D.   On: 03/28/2017 20:35   Ct Angio Chest Pe W And/or Wo Contrast  Result Date: 03/28/2017 CLINICAL DATA:  Chest pain, stabbing pain in the back and mid sternum EXAM: CT ANGIOGRAPHY CHEST WITH CONTRAST TECHNIQUE: Multidetector CT imaging of the chest was performed using the standard protocol during bolus administration of intravenous contrast. Multiplanar CT image reconstructions and MIPs were obtained to evaluate the vascular anatomy. CONTRAST:  135 cc Isovue 370 intravenous COMPARISON:  Radiograph 03/28/2017 FINDINGS: Cardiovascular: Respiratory motion limits evaluation of the lower lobe branch vessels. No filling defect within the central or main segmental pulmonary branch vessels to suggest acute embolus. Non aneurysmal aorta. Minimal atherosclerotic calcification. Scattered coronary artery calcification. Borderline heart size. No significant pericardial effusion Mediastinum/Nodes: No enlarged mediastinal, hilar, or axillary lymph nodes. Thyroid gland, trachea, and esophagus demonstrate no significant findings. Lungs/Pleura: Scattered hazy densities may reflect atelectasis. No consolidation, pleural effusion or pneumothorax. Upper Abdomen: Postsurgical changes of the stomach. Slightly enlarged gallbladder. Musculoskeletal: No chest wall abnormality. No acute or significant osseous findings. Review of the MIP images confirms the above findings. IMPRESSION: 1. Negative for acute pulmonary embolus.  Grossly clear lung fields 2. Partially visualized dilated gallbladder Aortic  Atherosclerosis (ICD10-I70.0). Electronically Signed   By: Jasmine Pang M.D.   On: 03/28/2017 21:47   US Abdomen Limited Ruq  Result Date: 03/28/2017 CLINICAL DATA:  Epigastric pain for 4 days. EXAM: ULTRASOUND ABDOMEN LIMITED RIGHT UPPER QUADRANT COMPARISON:  CT 02/08/2013 FINDINGS: Gallbladder: Distended measuring over 17 cm in length and 7 cm transverse. Sludge and small stones within the gallbladder. Normal gallbladder wall thickness of 2 mm. No pericholecystic fluid. No sonographic Murphy sign noted by sonographer. Common bile duct: Diameter: 6 mm. Liver: No focal lesion identified. Within normal limits in parenchymal echogenicity. Portal vein is patent on color Doppler imaging with normal direction of blood flow towards the liver. IMPRESSION: 1. Distended gallbladder containing sludge and small stones. No additional sonographic features to suggest acute cholecystitis. No gallbladder wall thickening. 2. No biliary dilatation. Electronically Signed   By: Rubye Oaks M.D.   On: 03/28/2017 23:24      Impression / Plan:   Katie Leon is a 61 y.o. y/o female with Gallstone pancreatitis. The patient was seen by surgery who suggested the patient undergo a outpatient cholecystectomy. The patient's lipase is  trending down. There is no sign of any bile duct obstruction therefore no ERCP is needed at the present time. Nothing to do from a GI point of view. I will sign off.  Please call if any further GI concerns or questions.  We would like to thank you for the opportunity to participate in the care of AZARIAH LATENDRESSE.     Thank you for involving me in the care of this patient.      LOS: 0 days   Katie Minium, MD  03/29/2017, 3:39 PM   Note: This dictation was prepared with Dragon dictation along with smaller phrase technology. Any transcriptional errors that result from this process are unintentional.

## 2017-03-29 NOTE — ED Notes (Signed)
Pt given water at this time and will return to see if she was able to hold down fluid

## 2017-03-29 NOTE — H&P (Signed)
[facility] - [place] at [facility]   PATIENT NAME: Katie Leon    MR#:  [id number]  DATE OF BIRTH:  1955/08/04  DATE OF ADMISSION:  03/28/2017  PRIMARY CARE PHYSICIAN: Derwood Kaplan, MD   REQUESTING/REFERRING PHYSICIAN: Lamont Snowball, MD  CHIEF COMPLAINT:   Chief Complaint  Patient presents with  . Chest Pain    HISTORY OF PRESENT ILLNESS:  Katie Leon  is a 61 y.o. female who presents with abdominal pain with persistent nausea for the last 3 days. Patient states that this pain is epigastric and radiated to her back. Here in the ED she was found to have significantly elevated lipase indicating pancreatitis. Patient is not an alcohol consumer. Ultrasound showed gallstones and gallbladder sludging.  PAST MEDICAL HISTORY:   Past Medical History:  Diagnosis Date  . Heart disease   . Hypertension     PAST SURGICAL HISTORY:   Past Surgical History:  Procedure Laterality Date  . ABDOMINAL HYSTERECTOMY    . Bilateral knee replacements     . CARDIAC CATHETERIZATION    . GASTROPLASTY VERTICAL BANDED  1999  . Hip replacement x2  Left   . Shouulder surgery       SOCIAL HISTORY:   Social History  Substance Use Topics  . Smoking status: Never Smoker  . Smokeless tobacco: Never Used  . Alcohol use No    FAMILY HISTORY:   Family History  Problem Relation Age of Onset  . Diabetes Father   . Diabetes Brother   . Breast cancer Neg Hx     DRUG ALLERGIES:  No Known Allergies  MEDICATIONS AT HOME:   Prior to Admission medications   Medication Sig Start Date End Date Taking? Authorizing Provider  diltiazem (CARDIZEM CD) 300 MG 24 hr capsule Take 300 mg by mouth daily. 02/22/17  Yes [provider]  meloxicam (MOBIC) 15 MG tablet Take 15 mg by mouth daily. 03/23/17  Yes [provider]  triamterene-hydrochlorothiazide (MAXZIDE-25) 37.5-25 MG tablet Take 1 tablet by mouth daily. 03/23/17  Yes [provider]    REVIEW OF  SYSTEMS:  Review of Systems  Constitutional: Negative for chills, fever, malaise/fatigue and weight loss.  HENT: Negative for ear pain, hearing loss and tinnitus.   Eyes: Negative for blurred vision, double vision, pain and redness.  Respiratory: Negative for cough, hemoptysis and shortness of breath.   Cardiovascular: Negative for chest pain, palpitations, orthopnea and leg swelling.  Gastrointestinal: Positive for abdominal pain and nausea. Negative for constipation, diarrhea and vomiting.  Genitourinary: Negative for dysuria, frequency and hematuria.  Musculoskeletal: Negative for back pain, joint pain and neck pain.  Skin:       No acne, rash, or lesions  Neurological: Negative for dizziness, tremors, focal weakness and weakness.  Endo/Heme/Allergies: Negative for polydipsia. Does not bruise/bleed easily.  Psychiatric/Behavioral: Negative for depression. The patient is not nervous/anxious and does not have insomnia.      VITAL SIGNS:   Vitals:   03/28/17 2133 03/28/17 2200 03/28/17 2230 03/29/17 0030  BP: (!) 158/80 (!) 162/91 (!) 175/88 (!) 164/83  Pulse: 62 (!) 49 (!) 56 (!) 51  Resp: (!) 29 12 14 13   Temp:      TempSrc:      SpO2: 99% 99% 100% 100%  Weight:      Height:       Wt Readings from Last 3 Encounters:  03/28/17 129.7 kg (286 lb)  03/14/16 113.4 kg (250 lb)  PHYSICAL EXAMINATION:  Physical Exam  Vitals reviewed. Constitutional: She is oriented to person, place, and time. She appears well-developed and well-nourished. No distress.  HENT:  Head: Normocephalic and atraumatic.  Mouth/Throat: Oropharynx is clear and moist.  Eyes: Pupils are equal, round, and reactive to light. Conjunctivae and EOM are normal. No scleral icterus.  Neck: Normal range of motion. Neck supple. No JVD present. No thyromegaly present.  Cardiovascular: Normal rate, regular rhythm and intact distal pulses.  Exam reveals no gallop and no friction rub.   No murmur heard. Respiratory:  Effort normal and breath sounds normal. No respiratory distress. She has no wheezes. She has no rales.  GI: Soft. Bowel sounds are normal. She exhibits no distension. There is tenderness.  Musculoskeletal: Normal range of motion. She exhibits no edema.  No arthritis, no gout  Lymphadenopathy:    She has no cervical adenopathy.  Neurological: She is alert and oriented to person, place, and time. No cranial nerve deficit.  No dysarthria, no aphasia  Skin: Skin is warm and dry. No rash noted. No erythema.  Psychiatric: She has a normal mood and affect. Her behavior is normal. Judgment and thought content normal.    LABORATORY PANEL:   CBC  Recent Labs Lab 03/28/17 2002  WBC 10.6  HGB 12.9  HCT 37.8  PLT 197   ------------------------------------------------------------------------------------------------------------------  Chemistries   Recent Labs Lab 03/28/17 2002  NA 139  K 3.2*  CL 104  CO2 26  GLUCOSE 101*  BUN 27*  CREATININE 0.93  CALCIUM 9.3  AST 152*  ALT 49  ALKPHOS 300*  BILITOT 0.7   ------------------------------------------------------------------------------------------------------------------  Cardiac Enzymes  Recent Labs Lab 03/28/17 2323  TROPONINI <0.03   ------------------------------------------------------------------------------------------------------------------  RADIOLOGY:  Dg Chest 2 View  Result Date: 03/28/2017 CLINICAL DATA:  Chest pain EXAM: CHEST  2 VIEW COMPARISON:  10/01/2012 FINDINGS: The heart size and mediastinal contours are within normal limits. Both lungs are clear. The visualized skeletal structures are unremarkable. IMPRESSION: No active cardiopulmonary disease. Electronically Signed   By: Alcide Clever M.D.   On: 03/28/2017 20:35   Ct Angio Chest Pe W And/or Wo Contrast  Result Date: 03/28/2017 CLINICAL DATA:  Chest pain, stabbing pain in the back and mid sternum EXAM: CT ANGIOGRAPHY CHEST WITH CONTRAST TECHNIQUE:  Multidetector CT imaging of the chest was performed using the standard protocol during bolus administration of intravenous contrast. Multiplanar CT image reconstructions and MIPs were obtained to evaluate the vascular anatomy. CONTRAST:  135 cc Isovue 370 intravenous COMPARISON:  Radiograph 03/28/2017 FINDINGS: Cardiovascular: Respiratory motion limits evaluation of the lower lobe branch vessels. No filling defect within the central or main segmental pulmonary branch vessels to suggest acute embolus. Non aneurysmal aorta. Minimal atherosclerotic calcification. Scattered coronary artery calcification. Borderline heart size. No significant pericardial effusion Mediastinum/Nodes: No enlarged mediastinal, hilar, or axillary lymph nodes. Thyroid gland, trachea, and esophagus demonstrate no significant findings. Lungs/Pleura: Scattered hazy densities may reflect atelectasis. No consolidation, pleural effusion or pneumothorax. Upper Abdomen: Postsurgical changes of the stomach. Slightly enlarged gallbladder. Musculoskeletal: No chest wall abnormality. No acute or significant osseous findings. Review of the MIP images confirms the above findings. IMPRESSION: 1. Negative for acute pulmonary embolus.  Grossly clear lung fields 2. Partially visualized dilated gallbladder Aortic Atherosclerosis (ICD10-I70.0). Electronically Signed   By: Jasmine Pang M.D.   On: 03/28/2017 21:47   US Abdomen Limited Ruq  Result Date: 03/28/2017 CLINICAL DATA:  Epigastric pain for 4 days. EXAM: ULTRASOUND ABDOMEN LIMITED RIGHT UPPER  QUADRANT COMPARISON:  CT 02/08/2013 FINDINGS: Gallbladder: Distended measuring over 17 cm in length and 7 cm transverse. Sludge and small stones within the gallbladder. Normal gallbladder wall thickness of 2 mm. No pericholecystic fluid. No sonographic Murphy sign noted by sonographer. Common bile duct: Diameter: 6 mm. Liver: No focal lesion identified. Within normal limits in parenchymal echogenicity. Portal vein  is patent on color Doppler imaging with normal direction of blood flow towards the liver. IMPRESSION: 1. Distended gallbladder containing sludge and small stones. No additional sonographic features to suggest acute cholecystitis. No gallbladder wall thickening. 2. No biliary dilatation. Electronically Signed   By: Rubye Oaks M.D.   On: 03/28/2017 23:24    EKG:   Orders placed or performed during the hospital encounter of 03/28/17  . ED EKG within 10 minutes  . ED EKG within 10 minutes  . EKG 12-Lead  . EKG 12-Lead    IMPRESSION AND PLAN:  Principal Problem:   Pancreatitis - nothing by mouth, IV fluids, when necessary analgesia and antiemetics, GI and surgery consults Active Problems:   Gallstones - treatment as above, this is the likely cause of her pancreatitis   HTN (hypertension) - continue home meds  All the records are reviewed and case discussed with ED provider. Management plans discussed with the patient and/or family.  DVT PROPHYLAXIS: SubQ lovenox  GI PROPHYLAXIS: None  ADMISSION STATUS: Inpatient  CODE STATUS: Full Code Status History    This patient does not have a recorded code status. Please follow your organizational policy for patients in this situation.    Advance Directive Documentation     Most Recent Value  Type of Advance Directive  Healthcare Power of Attorney  Pre-existing out of facility DNR order (yellow form or pink MOST form)  -  "MOST" Form in Place?  -      TOTAL TIME TAKING CARE OF THIS PATIENT: 45 minutes.   Norrine Ballester FIELDING 03/29/2017, 1:03 AM  [employer]  [phone]  CC: Primary care physician; Derwood Kaplan, MD  Note:  This document was prepared using Dragon voice recognition software and may include unintentional dictation errors.

## 2017-03-29 NOTE — Progress Notes (Signed)
Lovenox changed to 40 mg BID for BMI >40 and CrCl >30. 

## 2017-03-29 NOTE — ED Provider Notes (Signed)
Patient has a lipase of 588. She failed PO challenge.  Requires inpatient admission for IV antiemetics, IV fluids, and IV opioids.   Merrily Brittle, MD 03/29/17 [phone]

## 2017-03-29 NOTE — Consult Note (Signed)
Surgical Consultation  03/29/2017  Katie Leon is an 61 y.o. female.   Referring Physician:Sainani  CC: Back pain  HPI: This a patient who was admitted the hospital with significant back pain and epigastric pain and workup showing biliary pancreatitis as the likely cause. She has had nausea and is been going on for approximately 3 days. This morning her pain is completely gone as is her nausea. Her back pain is resolved as well. No new labs were drawn this morning but the patient denies acholic stools or dark urine. She's never had an episode like this before denies diabetes although she has a strong family history of diabetes. He has had a vertical banded gastroplasty.  Past Medical History:  Diagnosis Date  . Heart disease   . Hypertension     Past Surgical History:  Procedure Laterality Date  . ABDOMINAL HYSTERECTOMY    . Bilateral knee replacements     . CARDIAC CATHETERIZATION    . GASTROPLASTY VERTICAL BANDED  1999  . Hip replacement x2  Left   . Shouulder surgery       Family History  Problem Relation Age of Onset  . Diabetes Father   . Diabetes Brother   . Breast cancer Neg Hx     Social History:  reports that she has never smoked. She has never used smokeless tobacco. She reports that she does not drink alcohol or use drugs.  Allergies: No Known Allergies  Medications reviewed.   Review of Systems:   Review of Systems  Constitutional: Negative.   HENT: Negative.   Eyes: Negative.   Respiratory: Negative.   Cardiovascular: Negative.   Gastrointestinal: Positive for abdominal pain and nausea. Negative for constipation, diarrhea and heartburn.  Genitourinary: Negative.   Musculoskeletal: Negative.   Skin: Negative.   Neurological: Negative.   Endo/Heme/Allergies: Negative.   Psychiatric/Behavioral: Negative.      Physical Exam:  BP 128/62 (BP Location: Left Arm)   Pulse (!) 47   Temp (!) 97.4 F (36.3 C) (Oral)   Resp 18   Ht _0  (1.6 m)    Wt 275 lb (124.7 kg)   SpO2 100%   BMI 48.71 kg/m   Physical Exam  Constitutional: She is oriented to person, place, and time and well-developed, well-nourished, and in no distress. No distress.  HENT:  Head: Normocephalic and atraumatic.  Eyes: Pupils are equal, round, and reactive to light. Right eye exhibits no discharge. Left eye exhibits no discharge. No scleral icterus.  Neck: Normal range of motion.  Cardiovascular: Normal rate, regular rhythm and normal heart sounds.   Pulmonary/Chest: Effort normal and breath sounds normal. No respiratory distress. She has no wheezes.  Abdominal: Soft. She exhibits no distension. There is no tenderness. There is no rebound and no guarding.  Nontender abdomen negative Murphy sign periumbilical scar and laparoscopy scars  Musculoskeletal: Normal range of motion. She exhibits no edema or tenderness.  Lymphadenopathy:    She has no cervical adenopathy.  Neurological: She is alert and oriented to person, place, and time.  Skin: Skin is warm and dry. She is not diaphoretic. No erythema.  Psychiatric: Mood and affect normal.  Vitals reviewed.     Results for orders placed or performed during the hospital encounter of 03/28/17 (from the past 48 hour(s))  Basic metabolic panel     Status: Abnormal   Collection Time: 03/28/17  8:02 PM  Result Value Ref Range   Sodium 139 135 - 145 mmol/L  Potassium 3.2 (L) 3.5 - 5.1 mmol/L   Chloride 104 101 - 111 mmol/L   CO2 26 22 - 32 mmol/L   Glucose, Bld 101 (H) 65 - 99 mg/dL   BUN 27 (H) 6 - 20 mg/dL   Creatinine, Ser 0.93 0.44 - 1.00 mg/dL   Calcium 9.3 8.9 - 10.3 mg/dL   GFR calc non Af Amer >60 >60 mL/min   GFR calc Af Amer >60 >60 mL/min    Comment: (NOTE) The eGFR has been calculated using the CKD EPI equation. This calculation has not been validated in all clinical situations. eGFR's persistently <60 mL/min signify possible Chronic Kidney Disease.    Anion gap 9 5 - 15  CBC     Status:  Abnormal   Collection Time: 03/28/17  8:02 PM  Result Value Ref Range   WBC 10.6 3.6 - 11.0 K/uL   RBC 4.64 3.80 - 5.20 MIL/uL   Hemoglobin 12.9 12.0 - 16.0 g/dL   HCT 37.8 35.0 - 47.0 %   MCV 81.6 80.0 - 100.0 fL   MCH 27.8 26.0 - 34.0 pg   MCHC 34.1 32.0 - 36.0 g/dL   RDW 15.1 (H) 11.5 - 14.5 %   Platelets 197 150 - 440 K/uL  Troponin I     Status: None   Collection Time: 03/28/17  8:02 PM  Result Value Ref Range   Troponin I <0.03 <0.03 ng/mL  Brain natriuretic peptide     Status: None   Collection Time: 03/28/17  8:02 PM  Result Value Ref Range   B Natriuretic Peptide 34.0 0.0 - 100.0 pg/mL  Hepatic function panel     Status: Abnormal   Collection Time: 03/28/17  8:02 PM  Result Value Ref Range   Total Protein 7.2 6.5 - 8.1 g/dL   Albumin 3.9 3.5 - 5.0 g/dL   AST 152 (H) 15 - 41 U/L   ALT 49 14 - 54 U/L   Alkaline Phosphatase 300 (H) 38 - 126 U/L   Total Bilirubin 0.7 0.3 - 1.2 mg/dL   Bilirubin, Direct 0.2 0.1 - 0.5 mg/dL   Indirect Bilirubin 0.5 0.3 - 0.9 mg/dL  Lipase, blood     Status: Abnormal   Collection Time: 03/28/17  8:02 PM  Result Value Ref Range   Lipase 588 (H) 11 - 51 U/L    Comment: RESULT CONFIRMED BY MANUAL DILUTION.MSS  Troponin I     Status: None   Collection Time: 03/28/17 11:23 PM  Result Value Ref Range   Troponin I <0.03 <0.03 ng/mL  Basic metabolic panel     Status: Abnormal   Collection Time: 03/29/17  3:28 AM  Result Value Ref Range   Sodium 138 135 - 145 mmol/L   Potassium 3.0 (L) 3.5 - 5.1 mmol/L   Chloride 105 101 - 111 mmol/L   CO2 26 22 - 32 mmol/L   Glucose, Bld 117 (H) 65 - 99 mg/dL   BUN 20 6 - 20 mg/dL   Creatinine, Ser 0.48 0.44 - 1.00 mg/dL   Calcium 8.8 (L) 8.9 - 10.3 mg/dL   GFR calc non Af Amer >60 >60 mL/min   GFR calc Af Amer >60 >60 mL/min    Comment: (NOTE) The eGFR has been calculated using the CKD EPI equation. This calculation has not been validated in all clinical situations. eGFR's persistently <60 mL/min  signify possible Chronic Kidney Disease.    Anion gap 7 5 - 15  CBC  Status: Abnormal   Collection Time: 03/29/17  3:28 AM  Result Value Ref Range   WBC 7.2 3.6 - 11.0 K/uL   RBC 4.26 3.80 - 5.20 MIL/uL   Hemoglobin 11.9 (L) 12.0 - 16.0 g/dL   HCT 35.0 35.0 - 47.0 %   MCV 82.3 80.0 - 100.0 fL   MCH 27.9 26.0 - 34.0 pg   MCHC 33.9 32.0 - 36.0 g/dL   RDW 14.8 (H) 11.5 - 14.5 %   Platelets 181 150 - 440 K/uL  Lipase, blood     Status: Abnormal   Collection Time: 03/29/17  3:28 AM  Result Value Ref Range   Lipase 85 (H) 11 - 51 U/L  Hepatic function panel     Status: Abnormal   Collection Time: 03/29/17  3:28 AM  Result Value Ref Range   Total Protein 6.7 6.5 - 8.1 g/dL   Albumin 3.5 3.5 - 5.0 g/dL   AST 81 (H) 15 - 41 U/L   ALT 45 14 - 54 U/L   Alkaline Phosphatase 266 (H) 38 - 126 U/L   Total Bilirubin 0.6 0.3 - 1.2 mg/dL   Bilirubin, Direct <0.1 (L) 0.1 - 0.5 mg/dL   Indirect Bilirubin NOT CALCULATED 0.3 - 0.9 mg/dL   Dg Chest 2 View  Result Date: 03/28/2017 CLINICAL DATA:  Chest pain EXAM: CHEST  2 VIEW COMPARISON:  10/01/2012 FINDINGS: The heart size and mediastinal contours are within normal limits. Both lungs are clear. The visualized skeletal structures are unremarkable. IMPRESSION: No active cardiopulmonary disease. Electronically Signed   By: Inez Catalina M.D.   On: 03/28/2017 20:35   Ct Angio Chest Pe W And/or Wo Contrast  Result Date: 03/28/2017 CLINICAL DATA:  Chest pain, stabbing pain in the back and mid sternum EXAM: CT ANGIOGRAPHY CHEST WITH CONTRAST TECHNIQUE: Multidetector CT imaging of the chest was performed using the standard protocol during bolus administration of intravenous contrast. Multiplanar CT image reconstructions and MIPs were obtained to evaluate the vascular anatomy. CONTRAST:  135 cc Isovue 370 intravenous COMPARISON:  Radiograph 03/28/2017 FINDINGS: Cardiovascular: Respiratory motion limits evaluation of the lower lobe branch vessels. No filling  defect within the central or main segmental pulmonary branch vessels to suggest acute embolus. Non aneurysmal aorta. Minimal atherosclerotic calcification. Scattered coronary artery calcification. Borderline heart size. No significant pericardial effusion Mediastinum/Nodes: No enlarged mediastinal, hilar, or axillary lymph nodes. Thyroid gland, trachea, and esophagus demonstrate no significant findings. Lungs/Pleura: Scattered hazy densities may reflect atelectasis. No consolidation, pleural effusion or pneumothorax. Upper Abdomen: Postsurgical changes of the stomach. Slightly enlarged gallbladder. Musculoskeletal: No chest wall abnormality. No acute or significant osseous findings. Review of the MIP images confirms the above findings. IMPRESSION: 1. Negative for acute pulmonary embolus.  Grossly clear lung fields 2. Partially visualized dilated gallbladder Aortic Atherosclerosis (ICD10-I70.0). Electronically Signed   By: Donavan Foil M.D.   On: 03/28/2017 21:47   US Abdomen Limited Ruq  Result Date: 03/28/2017 CLINICAL DATA:  Epigastric pain for 4 days. EXAM: ULTRASOUND ABDOMEN LIMITED RIGHT UPPER QUADRANT COMPARISON:  CT 02/08/2013 FINDINGS: Gallbladder: Distended measuring over 17 cm in length and 7 cm transverse. Sludge and small stones within the gallbladder. Normal gallbladder wall thickness of 2 mm. No pericholecystic fluid. No sonographic Murphy sign noted by sonographer. Common bile duct: Diameter: 6 mm. Liver: No focal lesion identified. Within normal limits in parenchymal echogenicity. Portal vein is patent on color Doppler imaging with normal direction of blood flow towards the liver. IMPRESSION: 1. Distended gallbladder  containing sludge and small stones. No additional sonographic features to suggest acute cholecystitis. No gallbladder wall thickening. 2. No biliary dilatation. Electronically Signed   By: Jeb Levering M.D.   On: 03/28/2017 23:24    Assessment/Plan:  This a patient with  likely biliary pancreatitis. Her labs are being repeated this morning but are not yet available. I discussed this patient with prime doc and feel that because her pain is completely resolved she could be discharged if her lipase is normalizing. Clear liquid could be attempted. If the patient tolerates diet she can be discharged to follow-up in our office next week with plans for elective surgery.  Florene Glen, MD, FACS

## 2017-03-29 NOTE — Progress Notes (Signed)
[facility] - [place] [facility]   PATIENT NAME: Katie Leon    MR#:  [id number]  DATE OF BIRTH:  03/23/1956  SUBJECTIVE:   Patient here due to epigastric abdominal pain and noted to have acute pancreatitis. Lipase level has trended down, and pain has improved and attempted to give the patient clearly liquids but she developed further nausea vomiting.  REVIEW OF SYSTEMS:    Review of Systems  Constitutional: Negative for chills and fever.  HENT: Negative for congestion and tinnitus.   Eyes: Negative for blurred vision and double vision.  Respiratory: Negative for cough, shortness of breath and wheezing.   Cardiovascular: Negative for chest pain, orthopnea and PND.  Gastrointestinal: Positive for abdominal pain, nausea and vomiting. Negative for diarrhea.  Genitourinary: Negative for dysuria and hematuria.  Neurological: Negative for dizziness, sensory change and focal weakness.  All other systems reviewed and are negative.   Nutrition: Clear Liquids Tolerating Diet: No Tolerating PT:  Ambulatory     DRUG ALLERGIES:  No Known Allergies  VITALS:  Blood pressure 135/74, pulse (!) 50, temperature 97.9 F (36.6 C), temperature source Oral, resp. rate 18, height 5\' 3"  (1.6 m), weight 124.7 kg (275 lb), SpO2 98 %.  PHYSICAL EXAMINATION:   Physical Exam  GENERAL:  61 y.o.-year-old obese patient lying in bed in no acute distress.  EYES: Pupils equal, round, reactive to light and accommodation. No scleral icterus. Extraocular muscles intact.  HEENT: Head atraumatic, normocephalic. Oropharynx and nasopharynx clear.  NECK:  Supple, no jugular venous distention. No thyroid enlargement, no tenderness.  LUNGS: Normal breath sounds bilaterally, no wheezing, rales, rhonchi. No use of accessory muscles of respiration.  CARDIOVASCULAR: S1, S2 normal. No murmurs, rubs, or gallops.  ABDOMEN: Soft, nontender, nondistended. Bowel sounds present. No organomegaly or mass.   EXTREMITIES: No cyanosis, clubbing or edema b/l.    NEUROLOGIC: Cranial nerves II through XII are intact. No focal Motor or sensory deficits b/l.   PSYCHIATRIC: The patient is alert and oriented x 3.  SKIN: No obvious rash, lesion, or ulcer.    LABORATORY PANEL:   CBC  Recent Labs Lab 03/29/17 0328  WBC 7.2  HGB 11.9*  HCT 35.0  PLT 181   ------------------------------------------------------------------------------------------------------------------  Chemistries   Recent Labs Lab 03/29/17 0328  NA 138  K 3.0*  CL 105  CO2 26  GLUCOSE 117*  BUN 20  CREATININE 0.48  CALCIUM 8.8*  AST 81*  ALT 45  ALKPHOS 266*  BILITOT 0.6   ------------------------------------------------------------------------------------------------------------------  Cardiac Enzymes  Recent Labs Lab 03/28/17 2323  TROPONINI <0.03   ------------------------------------------------------------------------------------------------------------------  RADIOLOGY:  Dg Chest 2 View  Result Date: 03/28/2017 CLINICAL DATA:  Chest pain EXAM: CHEST  2 VIEW COMPARISON:  10/01/2012 FINDINGS: The heart size and mediastinal contours are within normal limits. Both lungs are clear. The visualized skeletal structures are unremarkable. IMPRESSION: No active cardiopulmonary disease. Electronically Signed   By: Alcide Clever M.D.   On: 03/28/2017 20:35   Ct Angio Chest Pe W And/or Wo Contrast  Result Date: 03/28/2017 CLINICAL DATA:  Chest pain, stabbing pain in the back and mid sternum EXAM: CT ANGIOGRAPHY CHEST WITH CONTRAST TECHNIQUE: Multidetector CT imaging of the chest was performed using the standard protocol during bolus administration of intravenous contrast. Multiplanar CT image reconstructions and MIPs were obtained to evaluate the vascular anatomy. CONTRAST:  135 cc Isovue 370 intravenous COMPARISON:  Radiograph 03/28/2017 FINDINGS: Cardiovascular: Respiratory motion limits evaluation of the lower  lobe branch  vessels. No filling defect within the central or main segmental pulmonary branch vessels to suggest acute embolus. Non aneurysmal aorta. Minimal atherosclerotic calcification. Scattered coronary artery calcification. Borderline heart size. No significant pericardial effusion Mediastinum/Nodes: No enlarged mediastinal, hilar, or axillary lymph nodes. Thyroid gland, trachea, and esophagus demonstrate no significant findings. Lungs/Pleura: Scattered hazy densities may reflect atelectasis. No consolidation, pleural effusion or pneumothorax. Upper Abdomen: Postsurgical changes of the stomach. Slightly enlarged gallbladder. Musculoskeletal: No chest wall abnormality. No acute or significant osseous findings. Review of the MIP images confirms the above findings. IMPRESSION: 1. Negative for acute pulmonary embolus.  Grossly clear lung fields 2. Partially visualized dilated gallbladder Aortic Atherosclerosis (ICD10-I70.0). Electronically Signed   By: Jasmine Pang M.D.   On: 03/28/2017 21:47   US Abdomen Limited Ruq  Result Date: 03/28/2017 CLINICAL DATA:  Epigastric pain for 4 days. EXAM: ULTRASOUND ABDOMEN LIMITED RIGHT UPPER QUADRANT COMPARISON:  CT 02/08/2013 FINDINGS: Gallbladder: Distended measuring over 17 cm in length and 7 cm transverse. Sludge and small stones within the gallbladder. Normal gallbladder wall thickness of 2 mm. No pericholecystic fluid. No sonographic Murphy sign noted by sonographer. Common bile duct: Diameter: 6 mm. Liver: No focal lesion identified. Within normal limits in parenchymal echogenicity. Portal vein is patent on color Doppler imaging with normal direction of blood flow towards the liver. IMPRESSION: 1. Distended gallbladder containing sludge and small stones. No additional sonographic features to suggest acute cholecystitis. No gallbladder wall thickening. 2. No biliary dilatation. Electronically Signed   By: Rubye Oaks M.D.   On: 03/28/2017 23:24      ASSESSMENT AND PLAN:   61 year old female with past medical history of hypertension,osteoarthritis who presented to the hospital due to abdominal pain nausea and vomiting.  1. Acute pancreatitis - this is the cause of patient's abdominal pain nausea vomiting. This is a biliary pancreatitis given the patient's ultrasound results. -continue supportive care with IV fluids, antibiotics complaint pain control. Lipase level has trended down, LFT stable. Seen by general surgery and no plans for acute surgical intervention but to be done as an outpatient.  2. Hypokalemia - will replace and repeat in a.m.   3. Essential HTN - cont. Cardizem, Triamterene/HCTZ.     All the records are reviewed and case discussed with Care Management/Social Worker. Management plans discussed with the patient, family and they are in agreement.  CODE STATUS: Full code  DVT Prophylaxis: Lovenox  TOTAL TIME TAKING CARE OF THIS PATIENT: 30 minutes.   POSSIBLE D/C IN 1-2 DAYS, DEPENDING ON CLINICAL CONDITION.   Houston Siren M.D on 03/29/2017 at 2:41 PM  Between 7am to 6pm - Pager - [phone]  After 6pm go to [email or web address] - Social research officer, government  [facility] [place] [facility]  Office  [phone]  CC: Primary care physician; Derwood Kaplan, MD

## 2017-03-30 LAB — COMPREHENSIVE METABOLIC PANEL
ALK PHOS: 222 U/L — AB (ref 38–126)
ALT: 31 U/L (ref 14–54)
AST: 31 U/L (ref 15–41)
Albumin: 3.4 g/dL — ABNORMAL LOW (ref 3.5–5.0)
Anion gap: 7 (ref 5–15)
BUN: 11 mg/dL (ref 6–20)
CALCIUM: 9.3 mg/dL (ref 8.9–10.3)
CHLORIDE: 105 mmol/L (ref 101–111)
CO2: 28 mmol/L (ref 22–32)
CREATININE: 0.78 mg/dL (ref 0.44–1.00)
GFR calc non Af Amer: >60 mL/min (ref >60)
GLUCOSE: 97 mg/dL (ref 65–99)
Potassium: 3.1 mmol/L — ABNORMAL LOW (ref 3.5–5.1)
SODIUM: 140 mmol/L (ref 135–145)
Total Bilirubin: 0.7 mg/dL (ref 0.3–1.2)
Total Protein: 6.9 g/dL (ref 6.5–8.1)

## 2017-03-30 LAB — LIPASE, BLOOD: Lipase: 22 U/L (ref 11–51)

## 2017-03-30 NOTE — Discharge Summary (Signed)
[facility] [place] [facility]   PATIENT NAME: Katie Leon    MR#:  [id number]  DATE OF BIRTH:  12-29-1955  DATE OF ADMISSION:  03/28/2017 ADMITTING PHYSICIAN: Oralia Manis, MD  DATE OF DISCHARGE: 03/30/2017  PRIMARY CARE PHYSICIAN: Derwood Kaplan, MD    ADMISSION DIAGNOSIS:  Abdominal pain [R10.9] Acute pancreatitis, unspecified complication status, unspecified pancreatitis type [K85.90]  DISCHARGE DIAGNOSIS:  Principal Problem:   Pancreatitis Active Problems:   Gallstones   HTN (hypertension)   SECONDARY DIAGNOSIS:   Past Medical History:  Diagnosis Date  . Heart disease   . Hypertension     HOSPITAL COURSE:   61 year old female with past medical history of hypertension,osteoarthritis who presented to the hospital due to abdominal pain nausea and vomiting.  1. Acute pancreatitis - this was the cause of patient's abdominal pain nausea vomiting. This was a biliary pancreatitis given the patient's ultrasound results as mentioned below. -patient was treated supportively with IV fluids, antiemetics.A GI and surgical consult was obtained.As per surgery patient does need a laparoscopic cholecystectomy in the future but not presently. -With supportive care patient's clinical symptoms and lipase and LFTs improved. She is tolerating a low-fat diet well now. She will be discharged home and follow up with general surgery as an outpatient to schedule a  cholecystectomy.  2. Hypokalemia - this has improved and resolved with supplementation.    3. Essential HTN - pt. Will cont. Cardizem, Triamterene/HCTZ.   DISCHARGE CONDITIONS:   Stable  CONSULTS OBTAINED:  Treatment Team:  Lattie Haw, MD  DRUG ALLERGIES:  No Known Allergies  DISCHARGE MEDICATIONS:   Allergies as of 03/30/2017   No Known Allergies     Medication List    TAKE these medications   diltiazem 300 MG 24 hr capsule Commonly known as:  CARDIZEM CD Take 300 mg by mouth  daily.   meloxicam 15 MG tablet Commonly known as:  MOBIC Take 15 mg by mouth daily.   triamterene-hydrochlorothiazide 37.5-25 MG tablet Commonly known as:  MAXZIDE-25 Take 1 tablet by mouth daily.            Discharge Care Instructions        Start     Ordered   03/30/17 0000  Activity as tolerated - No restrictions     03/30/17 1052   03/30/17 0000  Diet - low sodium heart healthy     03/30/17 1052        DISCHARGE INSTRUCTIONS:   DIET:  Low fat, Low cholesterol diet  DISCHARGE CONDITION:  Stable  ACTIVITY:  Activity as tolerated  OXYGEN:  Home Oxygen: No.   Oxygen Delivery: room air  DISCHARGE LOCATION:  home   If you experience worsening of your admission symptoms, develop shortness of breath, life threatening emergency, suicidal or homicidal thoughts you must seek medical attention immediately by calling 911 or calling your MD immediately  if symptoms less severe.  You Must read complete instructions/literature along with all the possible adverse reactions/side effects for all the Medicines you take and that have been prescribed to you. Take any new Medicines after you have completely understood and accpet all the possible adverse reactions/side effects.   Please note  You were cared for by a hospitalist during your hospital stay. If you have any questions about your discharge medications or the care you received while you were in the hospital after you are discharged, you can call the unit and asked to speak with the hospitalist  on call if the hospitalist that took care of you is not available. Once you are discharged, your primary care physician will handle any further medical issues. Please note that NO REFILLS for any discharge medications will be authorized once you are discharged, as it is imperative that you return to your primary care physician (or establish a relationship with a primary care physician if you do not have one) for your aftercare needs  so that they can reassess your need for medications and monitor your lab values.     Today   Complaining of some upper back/left sided shoulder pain. No epigastric pain, N/V.  Lipase, LFT's improved.   VITAL SIGNS:  Blood pressure 136/66, pulse (!) 50, temperature 97.9 F (36.6 C), temperature source Oral, resp. rate 20, height 5\' 3"  (1.6 m), weight 124.7 kg (275 lb), SpO2 97 %.  I/O:   Intake/Output Summary (Last 24 hours) at 03/30/17 1353 Last data filed at 03/30/17 1305  Gross per 24 hour  Intake             1745 ml  Output             2500 ml  Net             -755 ml    PHYSICAL EXAMINATION:   GENERAL:  61 y.o.-year-old obese patient lying in bed in no acute distress.  EYES: Pupils equal, round, reactive to light and accommodation. No scleral icterus. Extraocular muscles intact.  HEENT: Head atraumatic, normocephalic. Oropharynx and nasopharynx clear.  NECK:  Supple, no jugular venous distention. No thyroid enlargement, no tenderness.  LUNGS: Normal breath sounds bilaterally, no wheezing, rales, rhonchi. No use of accessory muscles of respiration.  CARDIOVASCULAR: S1, S2 normal. No murmurs, rubs, or gallops.  ABDOMEN: Soft, nontender, nondistended. Bowel sounds present. No organomegaly or mass.  EXTREMITIES: No cyanosis, clubbing or edema b/l.    NEUROLOGIC: Cranial nerves II through XII are intact. No focal Motor or sensory deficits b/l.   PSYCHIATRIC: The patient is alert and oriented x 3.  SKIN: No obvious rash, lesion, or ulcer.   DATA REVIEW:   CBC  Recent Labs Lab 03/29/17 0328  WBC 7.2  HGB 11.9*  HCT 35.0  PLT 181    Chemistries   Recent Labs Lab 03/30/17 0639  NA 140  K 3.1*  CL 105  CO2 28  GLUCOSE 97  BUN 11  CREATININE 0.78  CALCIUM 9.3  AST 31  ALT 31  ALKPHOS 222*  BILITOT 0.7    Cardiac Enzymes  Recent Labs Lab 03/28/17 2323  TROPONINI <0.03    Microbiology Results  No results found for this or any previous  visit.  RADIOLOGY:  Dg Chest 2 View  Result Date: 03/28/2017 CLINICAL DATA:  Chest pain EXAM: CHEST  2 VIEW COMPARISON:  10/01/2012 FINDINGS: The heart size and mediastinal contours are within normal limits. Both lungs are clear. The visualized skeletal structures are unremarkable. IMPRESSION: No active cardiopulmonary disease. Electronically Signed   By: Alcide Clever M.D.   On: 03/28/2017 20:35   Ct Angio Chest Pe W And/or Wo Contrast  Result Date: 03/28/2017 CLINICAL DATA:  Chest pain, stabbing pain in the back and mid sternum EXAM: CT ANGIOGRAPHY CHEST WITH CONTRAST TECHNIQUE: Multidetector CT imaging of the chest was performed using the standard protocol during bolus administration of intravenous contrast. Multiplanar CT image reconstructions and MIPs were obtained to evaluate the vascular anatomy. CONTRAST:  135 cc Isovue 370 intravenous COMPARISON:  Radiograph 03/28/2017 FINDINGS: Cardiovascular: Respiratory motion limits evaluation of the lower lobe branch vessels. No filling defect within the central or main segmental pulmonary branch vessels to suggest acute embolus. Non aneurysmal aorta. Minimal atherosclerotic calcification. Scattered coronary artery calcification. Borderline heart size. No significant pericardial effusion Mediastinum/Nodes: No enlarged mediastinal, hilar, or axillary lymph nodes. Thyroid gland, trachea, and esophagus demonstrate no significant findings. Lungs/Pleura: Scattered hazy densities may reflect atelectasis. No consolidation, pleural effusion or pneumothorax. Upper Abdomen: Postsurgical changes of the stomach. Slightly enlarged gallbladder. Musculoskeletal: No chest wall abnormality. No acute or significant osseous findings. Review of the MIP images confirms the above findings. IMPRESSION: 1. Negative for acute pulmonary embolus.  Grossly clear lung fields 2. Partially visualized dilated gallbladder Aortic Atherosclerosis (ICD10-I70.0). Electronically Signed   By: Jasmine Pang M.D.   On: 03/28/2017 21:47   US Abdomen Limited Ruq  Result Date: 03/28/2017 CLINICAL DATA:  Epigastric pain for 4 days. EXAM: ULTRASOUND ABDOMEN LIMITED RIGHT UPPER QUADRANT COMPARISON:  CT 02/08/2013 FINDINGS: Gallbladder: Distended measuring over 17 cm in length and 7 cm transverse. Sludge and small stones within the gallbladder. Normal gallbladder wall thickness of 2 mm. No pericholecystic fluid. No sonographic Murphy sign noted by sonographer. Common bile duct: Diameter: 6 mm. Liver: No focal lesion identified. Within normal limits in parenchymal echogenicity. Portal vein is patent on color Doppler imaging with normal direction of blood flow towards the liver. IMPRESSION: 1. Distended gallbladder containing sludge and small stones. No additional sonographic features to suggest acute cholecystitis. No gallbladder wall thickening. 2. No biliary dilatation. Electronically Signed   By: Rubye Oaks M.D.   On: 03/28/2017 23:24      Management plans discussed with the patient, family and they are in agreement.  CODE STATUS:     Code Status Orders        Start     Ordered   03/29/17 0153  Full code  Continuous     03/29/17 0152    TOTAL TIME TAKING CARE OF THIS PATIENT: 40 minutes.    Houston Siren M.D on 03/30/2017 at 1:53 PM  Between 7am to 6pm - Pager - [phone]  After 6pm go to [email or web address] - Social research officer, government  [facility]  Office  [phone]  CC: Primary care physician; Derwood Kaplan, MD

## 2017-03-30 NOTE — Progress Notes (Signed)
Patient A&O, VSS.  No complaints of pain and nausea; diet advanced and patient tolerated well.  IVs removed without difficulty.  Discharge instructions reviewed with patient.  Understanding was verbalized and all questions were answered.  Patient discharged home ambulatory in stable condition escorted by nursing staff.

## 2017-04-09 DIAGNOSIS — I519 Heart disease, unspecified: Secondary | ICD-10-CM | POA: Insufficient documentation

## 2017-04-14 ENCOUNTER — Ambulatory Visit (INDEPENDENT_AMBULATORY_CARE_PROVIDER_SITE_OTHER): Payer: Medicare Other | Admitting: General Surgery

## 2017-04-14 ENCOUNTER — Telehealth: Payer: Self-pay

## 2017-04-14 VITALS — BP 142/80 | HR 55 | Temp 97.5°F | Ht 63.0 in | Wt 271.4 lb

## 2017-04-14 DIAGNOSIS — K8050 Calculus of bile duct without cholangitis or cholecystitis without obstruction: Secondary | ICD-10-CM

## 2017-04-14 NOTE — Telephone Encounter (Signed)
Patient advised of Pre op date and time and Surgery date.  Sx: 04/19/17 - Dr Earlene Plateravis - Lap Chole w/ Gram  Pre Op: 04/15/17 at 1:00 pm, Office  Patient made aware to call [phone], between 1-3 pm the day before surgery to find out what time to arrive

## 2017-04-14 NOTE — Patient Instructions (Signed)
You have requested to have your gallbladder removed. This will be done on 04/19/17 at [facility] with Dr. Earlene Plateravis.  You will most likely be out of work 1-2 weeks for this surgery. You will return after your post-op appointment with a lifting restriction for approximately 4 more weeks.  You will be able to eat anything you would like to following surgery. But, start by eating a bland diet and advance this as tolerated. The Gallbladder diet is below, please go as closely by this diet as possible prior to surgery to avoid any further attacks.  Please see the (blue)pre-care form that you have been given today. If you have any questions, please call our office.  Laparoscopic Cholecystectomy Laparoscopic cholecystectomy is surgery to remove the gallbladder. The gallbladder is located in the upper right part of the abdomen, behind the liver. It is a storage sac for bile, which is produced in the liver. Bile aids in the digestion and absorption of fats. Cholecystectomy is often done for inflammation of the gallbladder (cholecystitis). This condition is usually caused by a buildup of gallstones (cholelithiasis) in the gallbladder. Gallstones can block the flow of bile, and that can result in inflammation and pain. In severe cases, emergency surgery may be required. If emergency surgery is not required, you will have time to prepare for the procedure. Laparoscopic surgery is an alternative to open surgery. Laparoscopic surgery has a shorter recovery time. Your common bile duct may also need to be examined during the procedure. If stones are found in the common bile duct, they may be removed. LET [facility] PROVIDER KNOW ABOUT:  Any allergies you have.  All medicines you are taking, including vitamins, herbs, eye drops, creams, and over-the-counter medicines.  Previous problems you or members of your family have had with the use of anesthetics.  Any blood disorders you have.  Previous surgeries  you have had.    Any medical conditions you have. RISKS AND COMPLICATIONS Generally, this is a safe procedure. However, problems may occur, including:  Infection.  Bleeding.  Allergic reactions to medicines.  Damage to other structures or organs.  A stone remaining in the common bile duct.  A bile leak from the cyst duct that is clipped when your gallbladder is removed.  The need to convert to open surgery, which requires a larger incision in the abdomen. This may be necessary if your surgeon thinks that it is not safe to continue with a laparoscopic procedure. BEFORE THE PROCEDURE  Ask your health care provider about:  Changing or stopping your regular medicines. This is especially important if you are taking diabetes medicines or blood thinners.  Taking medicines such as aspirin and ibuprofen. These medicines can thin your blood. Do not take these medicines before your procedure if your health care provider instructs you not to.  Follow instructions from your health care provider about eating or drinking restrictions.  Let your health care provider know if you develop a cold or an infection before surgery.  Plan to have someone take you home after the procedure.  Ask your health care provider how your surgical site will be marked or identified.  You may be given antibiotic medicine to help prevent infection. PROCEDURE  To reduce your risk of infection:  Your health care team will wash or sanitize their hands.  Your skin will be washed with soap.  An IV tube may be inserted into one of your veins.  You will be given a medicine to make  you fall asleep (general anesthetic).  A breathing tube will be placed in your mouth.  The surgeon will make several small cuts (incisions) in your abdomen.  A thin, lighted tube (laparoscope) that has a tiny camera on the end will be inserted through one of the small incisions. The camera on the laparoscope will send a picture to  a TV screen (monitor) in the operating room. This will give the surgeon a good view inside your abdomen.  A gas will be pumped into your abdomen. This will expand your abdomen to give the surgeon more room to perform the surgery.  Other tools that are needed for the procedure will be inserted through the other incisions. The gallbladder will be removed through one of the incisions.  After your gallbladder has been removed, the incisions will be closed with stitches (sutures), staples, or skin glue.  Your incisions may be covered with a bandage (dressing). The procedure may vary among health care providers and hospitals. AFTER THE PROCEDURE  Your blood pressure, heart rate, breathing rate, and blood oxygen level will be monitored often until the medicines you were given have worn off.  You will be given medicines as needed to control your pain.   This information is not intended to replace advice given to you by your health care provider. Make sure you discuss any questions you have with your health care provider.   Document Released: 07/20/2005 Document Revised: 04/10/2015 Document Reviewed: 03/01/2013 Elsevier Interactive Patient Education 2016 [place] Diet for Gallbladder Conditions A low-fat diet can be helpful if you have pancreatitis or a gallbladder condition. With these conditions, your pancreas and gallbladder have trouble digesting fats. A healthy eating plan with less fat will help rest your pancreas and gallbladder and reduce your symptoms. WHAT DO I NEED TO KNOW ABOUT THIS DIET?  Eat a low-fat diet.  Reduce your fat intake to less than 20-30% of your total daily calories. This is less than 50-60 g of fat per day.  Remember that you need some fat in your diet. Ask your dietician what your daily goal should be.  Choose nonfat and low-fat healthy foods. Look for the words "nonfat," "low fat," or "fat free."  As a guide, look on the label and choose foods  with less than 3 g of fat per serving. Eat only one serving.  Avoid alcohol.  Do not smoke. If you need help quitting, talk with your health care provider.  Eat small frequent meals instead of three large heavy meals. WHAT FOODS CAN I EAT? Grains Include healthy grains and starches such as potatoes, wheat bread, fiber-rich cereal, and brown rice. Choose whole grain options whenever possible. In adults, whole grains should account for 45-65% of your daily calories.  Fruits and Vegetables Eat plenty of fruits and vegetables. Fresh fruits and vegetables add fiber to your diet. Meats and Other Protein Sources Eat lean meat such as chicken and pork. Trim any fat off of meat before cooking it. Eggs, fish, and beans are other sources of protein. In adults, these foods should account for 10-35% of your daily calories. Dairy Choose low-fat milk and dairy options. Dairy includes fat and protein, as well as calcium.  Fats and Oils Limit high-fat foods such as fried foods, sweets, baked goods, sugary drinks.  Other Creamy sauces and condiments, such as mayonnaise, can add extra fat. Think about whether or not you need to use them, or use smaller amounts or low fat options.  WHAT FOODS ARE NOT RECOMMENDED?  High fat foods, such as:  [employer].  Ice cream.  [place] toast.  Sweet rolls.  Pizza.  Cheese bread.  Foods covered with batter, butter, creamy sauces, or cheese.  Fried foods.  Sugary drinks and desserts.  Foods that cause gas or bloating   This information is not intended to replace advice given to you by your health care provider. Make sure you discuss any questions you have with your health care provider.   Document Released: 07/25/2013 Document Reviewed: 07/25/2013 Elsevier Interactive Patient Education [employer].
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
## 2017-04-15 ENCOUNTER — Encounter
Admission: RE | Admit: 2017-04-15 | Discharge: 2017-04-15 | Disposition: A | Discharge status: Home / Self Care | Payer: Medicare Other | Source: Ambulatory Visit | Attending: Surgery | Admitting: Surgery

## 2017-04-15 DIAGNOSIS — K8050 Calculus of bile duct without cholangitis or cholecystitis without obstruction: Secondary | ICD-10-CM | POA: Insufficient documentation

## 2017-04-15 DIAGNOSIS — Z01812 Encounter for preprocedural laboratory examination: Secondary | ICD-10-CM | POA: Insufficient documentation

## 2017-04-15 DIAGNOSIS — K8020 Calculus of gallbladder without cholecystitis without obstruction: Secondary | ICD-10-CM | POA: Insufficient documentation

## 2017-04-15 DIAGNOSIS — I10 Essential (primary) hypertension: Secondary | ICD-10-CM | POA: Diagnosis not present

## 2017-04-15 DIAGNOSIS — K859 Acute pancreatitis without necrosis or infection, unspecified: Secondary | ICD-10-CM | POA: Diagnosis not present

## 2017-04-15 DIAGNOSIS — I519 Heart disease, unspecified: Secondary | ICD-10-CM | POA: Diagnosis not present

## 2017-04-15 HISTORY — DX: Unspecified osteoarthritis, unspecified site: M19.90

## 2017-04-15 LAB — POTASSIUM: Potassium: 3.3 mmol/L — ABNORMAL LOW (ref 3.5–5.1)

## 2017-04-15 NOTE — Patient Instructions (Signed)
Your procedure is scheduled on: April 19, 2017 (Monday) Report to Same Day Surgery 2nd floor medical mall [facility] Entrance-take elevator on left to 2nd floor.  Check in with surgery information desk.) To find out your arrival time please call [phone] between 1PM - 3PM on April 16, 2017 (Friday )   Remember: Instructions that are not followed completely may result in serious medical risk, up to and including death, or upon the discretion of your surgeon and anesthesiologist your surgery may need to be rescheduled.    _x___ 1. Do not eat food after midnight the night before your procedure. You may drink clear liquids up to 2 hours before you are scheduled to arrive at the hospital for your procedure.  Do not drink clear liquids within 2 hours of your scheduled arrival to the hospital.  Clear liquids include  --Water or Apple juice without pulp  --Clear carbohydrate beverage such as ClearFast or Gatorade  --Black Coffee or Clear Tea (No milk, no creamers, do not add anything to                  the coffee or Tea Type 1 and type 2 diabetics should only drink water.  No gum chewing or hard candies.     __x__ 2. No Alcohol for 24 hours before or after surgery.   __x__3. No Smoking for 24 prior to surgery.   ____  4. Bring all medications with you on the day of surgery if instructed.    __x__ 5. Notify your doctor if there is any change in your medical condition     (cold, fever, infections).     Do not wear jewelry, make-up, hairpins, clips or nail polish.  Do not wear lotions, powders, or perfumes.   Do not shave 48 hours prior to surgery. Men may shave face and neck.  Do not bring valuables to the hospital.    [facility] is not responsible for any belongings or valuables.               Contacts, dentures or bridgework may not be worn into surgery.  Leave your suitcase in the car. After surgery it may be brought to your room.  For patients admitted to the hospital,  discharge time is determined by your  treatment team                      Patients discharged the day of surgery will not be allowed to drive home.  You will need someone to drive you home and stay with you the night of your procedure.    Please read over the following fact sheets that you were given:   [facility] Preparing for Surgery and or MRSA Information   TAKE THE FOLLOWING MEDICATION WITH A SIP OF WATER THE MORNING OF SURGERY  1. DILTIAZEM (CARDIZEM )  2.  3.  4.  5.  6.  ____Fleets enema or Magnesium Citrate as directed.   _x___ Use CHG Soap or sage wipes as directed on instruction sheet   ____ Use inhalers on the day of surgery and bring to hospital day of surgery  ____ Stop Metformin and Janumet 2 days prior to surgery.    ____ Take 1/2 of usual insulin dose the night before surgery and none on the morning surgery    _x___ Follow recommendations from Cardiologist, Pulmonologist or PCP regarding          stopping Aspirin, Coumadin, Plavix ,  Eliquis, Effient, or Pradaxa, and Pletal.  X____Stop Anti-inflammatories such as Advil, Aleve, Ibuprofen, Motrin, Naproxen, Naprosyn, Goodies powders or aspirin products. OK to take Tylenol  (STOP MOBIC, ( MELOXICAM ) NOW   _x___ Stop supplements until after surgery.  But may continue Vitamin D, Vitamin B,and multivitamin        ____ Bring C-Pap to the hospital.

## 2017-04-15 NOTE — Pre-Procedure Instructions (Signed)
Potassium results faxed to Dr. Earlene Plateravis office.

## 2017-04-18 MED ORDER — CIPROFLOXACIN IN D5W 400 MG/200ML IV SOLN
400.0000 mg | INTRAVENOUS | Status: AC
  Administered 2017-04-19: 400 mg via INTRAVENOUS

## 2017-04-18 MED ORDER — METRONIDAZOLE IN NACL 5-0.79 MG/ML-% IV SOLN
500.0000 mg | INTRAVENOUS | Status: AC
  Administered 2017-04-19: 500 mg via INTRAVENOUS
  Filled 2017-04-18: qty 100

## 2017-04-19 ENCOUNTER — Ambulatory Visit
Admission: RE | Admit: 2017-04-19 | Discharge: 2017-04-19 | Disposition: A | Discharge status: Home / Self Care | Payer: Medicare Other | Source: Ambulatory Visit | Attending: Surgery | Admitting: Surgery

## 2017-04-19 ENCOUNTER — Encounter
Admission: RE | Disposition: A | Discharge status: Home / Self Care | Payer: Self-pay | Source: Ambulatory Visit | Attending: Surgery

## 2017-04-19 ENCOUNTER — Ambulatory Visit: Payer: Medicare Other | Admitting: Anesthesiology

## 2017-04-19 DIAGNOSIS — K851 Biliary acute pancreatitis without necrosis or infection: Secondary | ICD-10-CM | POA: Diagnosis not present

## 2017-04-19 DIAGNOSIS — K8010 Calculus of gallbladder with chronic cholecystitis without obstruction: Secondary | ICD-10-CM | POA: Diagnosis not present

## 2017-04-19 DIAGNOSIS — Z96649 Presence of unspecified artificial hip joint: Secondary | ICD-10-CM | POA: Insufficient documentation

## 2017-04-19 DIAGNOSIS — K828 Other specified diseases of gallbladder: Secondary | ICD-10-CM | POA: Insufficient documentation

## 2017-04-19 DIAGNOSIS — Z9884 Bariatric surgery status: Secondary | ICD-10-CM | POA: Diagnosis not present

## 2017-04-19 DIAGNOSIS — Z96653 Presence of artificial knee joint, bilateral: Secondary | ICD-10-CM | POA: Diagnosis not present

## 2017-04-19 DIAGNOSIS — Z833 Family history of diabetes mellitus: Secondary | ICD-10-CM | POA: Diagnosis not present

## 2017-04-19 DIAGNOSIS — Z90710 Acquired absence of both cervix and uterus: Secondary | ICD-10-CM | POA: Diagnosis not present

## 2017-04-19 DIAGNOSIS — I10 Essential (primary) hypertension: Secondary | ICD-10-CM | POA: Insufficient documentation

## 2017-04-19 DIAGNOSIS — Z6841 Body Mass Index (BMI) 40.0 and over, adult: Secondary | ICD-10-CM | POA: Insufficient documentation

## 2017-04-19 DIAGNOSIS — K859 Acute pancreatitis without necrosis or infection, unspecified: Secondary | ICD-10-CM | POA: Diagnosis present

## 2017-04-19 HISTORY — PX: CHOLECYSTECTOMY: SHX55

## 2017-04-19 LAB — POTASSIUM: POTASSIUM: 3.1 mmol/L — AB (ref 3.5–5.1)

## 2017-04-19 SURGERY — LAPAROSCOPIC CHOLECYSTECTOMY
Anesthesia: General | Wound class: Clean Contaminated

## 2017-04-19 MED ORDER — ONDANSETRON HCL 4 MG/2ML IJ SOLN
INTRAMUSCULAR | Status: DC | PRN
  Administered 2017-04-19: 4 mg via INTRAVENOUS

## 2017-04-19 MED ORDER — ROCURONIUM BROMIDE 100 MG/10ML IV SOLN
INTRAVENOUS | Status: DC | PRN
  Administered 2017-04-19: 40 mg via INTRAVENOUS

## 2017-04-19 MED ORDER — FENTANYL CITRATE (PF) 100 MCG/2ML IJ SOLN
25.0000 ug | INTRAMUSCULAR | Status: DC | PRN
  Administered 2017-04-19 (×4): 25 ug via INTRAVENOUS

## 2017-04-19 MED ORDER — DEXAMETHASONE SODIUM PHOSPHATE 10 MG/ML IJ SOLN
INTRAMUSCULAR | Status: DC | PRN
  Administered 2017-04-19: 10 mg via INTRAVENOUS

## 2017-04-19 MED ORDER — FENTANYL CITRATE (PF) 100 MCG/2ML IJ SOLN
INTRAMUSCULAR | Status: DC | PRN
  Administered 2017-04-19: 100 ug via INTRAVENOUS

## 2017-04-19 MED ORDER — SUCCINYLCHOLINE CHLORIDE 20 MG/ML IJ SOLN
INTRAMUSCULAR | Status: AC
  Filled 2017-04-19: qty 1

## 2017-04-19 MED ORDER — ROCURONIUM BROMIDE 50 MG/5ML IV SOLN
INTRAVENOUS | Status: AC
  Filled 2017-04-19: qty 1

## 2017-04-19 MED ORDER — OXYCODONE-ACETAMINOPHEN 5-325 MG PO TABS: 1.0000 | ORAL_TABLET | ORAL | 0 refills | Status: DC | PRN

## 2017-04-19 MED ORDER — LIDOCAINE HCL (PF) 1 % IJ SOLN
INTRAMUSCULAR | Status: AC
  Filled 2017-04-19: qty 30

## 2017-04-19 MED ORDER — ACETAMINOPHEN 10 MG/ML IV SOLN
INTRAVENOUS | Status: AC
  Filled 2017-04-19: qty 100

## 2017-04-19 MED ORDER — CHLORHEXIDINE GLUCONATE CLOTH 2 % EX PADS: 6.0000 | MEDICATED_PAD | Freq: Once | CUTANEOUS | Status: DC

## 2017-04-19 MED ORDER — PROPOFOL 10 MG/ML IV BOLUS
INTRAVENOUS | Status: DC | PRN
  Administered 2017-04-19: 200 mg via INTRAVENOUS

## 2017-04-19 MED ORDER — FENTANYL CITRATE (PF) 100 MCG/2ML IJ SOLN
INTRAMUSCULAR | Status: AC
  Filled 2017-04-19: qty 2

## 2017-04-19 MED ORDER — DEXAMETHASONE SODIUM PHOSPHATE 10 MG/ML IJ SOLN
INTRAMUSCULAR | Status: AC
  Filled 2017-04-19: qty 1

## 2017-04-19 MED ORDER — ONDANSETRON HCL 4 MG/2ML IJ SOLN
INTRAMUSCULAR | Status: AC
  Filled 2017-04-19: qty 2

## 2017-04-19 MED ORDER — ACETAMINOPHEN 10 MG/ML IV SOLN
INTRAVENOUS | Status: DC | PRN
  Administered 2017-04-19: 1000 mg via INTRAVENOUS

## 2017-04-19 MED ORDER — LACTATED RINGERS IV SOLN
INTRAVENOUS | Status: DC
  Administered 2017-04-19: 1000 mL via INTRAVENOUS

## 2017-04-19 MED ORDER — OXYCODONE-ACETAMINOPHEN 5-325 MG PO TABS
1.0000 | ORAL_TABLET | ORAL | Status: DC | PRN
  Administered 2017-04-19: 1 via ORAL

## 2017-04-19 MED ORDER — SUGAMMADEX SODIUM 200 MG/2ML IV SOLN
INTRAVENOUS | Status: DC | PRN
  Administered 2017-04-19: 250 mg via INTRAVENOUS

## 2017-04-19 MED ORDER — MIDAZOLAM HCL 2 MG/2ML IJ SOLN
INTRAMUSCULAR | Status: AC
  Filled 2017-04-19: qty 2

## 2017-04-19 MED ORDER — PROPOFOL 10 MG/ML IV BOLUS
INTRAVENOUS | Status: AC
  Filled 2017-04-19: qty 20

## 2017-04-19 MED ORDER — PHENYLEPHRINE HCL 10 MG/ML IJ SOLN
INTRAMUSCULAR | Status: DC | PRN
  Administered 2017-04-19: 200 ug via INTRAVENOUS
  Administered 2017-04-19: 100 ug via INTRAVENOUS

## 2017-04-19 MED ORDER — ONDANSETRON HCL 4 MG/2ML IJ SOLN
4.0000 mg | Freq: Once | INTRAMUSCULAR | Status: AC | PRN
  Administered 2017-04-19: 4 mg via INTRAVENOUS

## 2017-04-19 MED ORDER — FAMOTIDINE 20 MG PO TABS
20.0000 mg | ORAL_TABLET | Freq: Once | ORAL | Status: AC
  Administered 2017-04-19: 20 mg via ORAL

## 2017-04-19 MED ORDER — SUCCINYLCHOLINE CHLORIDE 20 MG/ML IJ SOLN
INTRAMUSCULAR | Status: DC | PRN
  Administered 2017-04-19: 140 mg via INTRAVENOUS

## 2017-04-19 MED ORDER — CIPROFLOXACIN IN D5W 400 MG/200ML IV SOLN
INTRAVENOUS | Status: AC
  Filled 2017-04-19: qty 200

## 2017-04-19 MED ORDER — OXYCODONE-ACETAMINOPHEN 5-325 MG PO TABS
ORAL_TABLET | ORAL | Status: AC
  Filled 2017-04-19: qty 1

## 2017-04-19 MED ORDER — BUPIVACAINE HCL (PF) 0.5 % IJ SOLN
INTRAMUSCULAR | Status: AC
  Filled 2017-04-19: qty 30

## 2017-04-19 MED ORDER — FAMOTIDINE 20 MG PO TABS
ORAL_TABLET | ORAL | Status: AC
  Administered 2017-04-19: 20 mg via ORAL
  Filled 2017-04-19: qty 1

## 2017-04-19 MED ORDER — LIDOCAINE HCL 1 % IJ SOLN
INTRAMUSCULAR | Status: DC | PRN
  Administered 2017-04-19: 26 mL via SUBCUTANEOUS

## 2017-04-19 MED ORDER — LIDOCAINE HCL (CARDIAC) 20 MG/ML IV SOLN
INTRAVENOUS | Status: DC | PRN
  Administered 2017-04-19: 80 mg via INTRAVENOUS

## 2017-04-19 MED ORDER — MIDAZOLAM HCL 2 MG/2ML IJ SOLN
INTRAMUSCULAR | Status: DC | PRN
  Administered 2017-04-19: 2 mg via INTRAVENOUS

## 2017-04-19 SURGICAL SUPPLY — 42 items
APPLICATOR ARISTA FLEXITIP XL (MISCELLANEOUS) ×3 IMPLANT
APPLIER CLIP ROT 10 11.4 M/L (STAPLE) ×3
CATH REDDICK CHOLANGI 4FR 50CM (CATHETERS) IMPLANT
CHLORAPREP W/TINT 26ML (MISCELLANEOUS) ×3 IMPLANT
CLIP APPLIE ROT 10 11.4 M/L (STAPLE) ×1 IMPLANT
DECANTER SPIKE VIAL GLASS SM (MISCELLANEOUS) IMPLANT
DEFOGGER SCOPE WARMER CLEARIFY (MISCELLANEOUS) ×3 IMPLANT
DERMABOND ADVANCED (GAUZE/BANDAGES/DRESSINGS) ×2
DERMABOND ADVANCED .7 DNX12 (GAUZE/BANDAGES/DRESSINGS) ×1 IMPLANT
DRESSING SURGICEL FIBRLLR 1X2 (HEMOSTASIS) IMPLANT
DRSG SURGICEL FIBRILLAR 1X2 (HEMOSTASIS)
ELECT REM PT RETURN 9FT ADLT (ELECTROSURGICAL) ×3
ELECTRODE REM PT RTRN 9FT ADLT (ELECTROSURGICAL) ×1 IMPLANT
GLOVE BIO SURGEON STRL SZ7 (GLOVE) ×3 IMPLANT
GLOVE BIOGEL PI IND STRL 7.5 (GLOVE) ×1 IMPLANT
GLOVE BIOGEL PI INDICATOR 7.5 (GLOVE) ×2
GOWN STRL REUS W/ TWL LRG LVL3 (GOWN DISPOSABLE) ×3 IMPLANT
GOWN STRL REUS W/TWL LRG LVL3 (GOWN DISPOSABLE) ×6
GRASPER SUT TROCAR 14GX15 (MISCELLANEOUS) ×3 IMPLANT
HEMOSTAT ARISTA ABSORB 3G PWDR (MISCELLANEOUS) ×3 IMPLANT
IRRIGATION STRYKERFLOW (MISCELLANEOUS) IMPLANT
IRRIGATOR STRYKERFLOW (MISCELLANEOUS)
IV NS 1000ML (IV SOLUTION)
IV NS 1000ML BAXH (IV SOLUTION) IMPLANT
KIT RM TURNOVER STRD PROC AR (KITS) ×3 IMPLANT
NDL SAFETY ECLIPSE 18X1.5 (NEEDLE) ×1 IMPLANT
NEEDLE HYPO 18GX1.5 SHARP (NEEDLE) ×2
NEEDLE HYPO 22GX1.5 SAFETY (NEEDLE) ×3 IMPLANT
NEEDLE INSUFFLATION 14GA 120MM (NEEDLE) ×3 IMPLANT
NS IRRIG 1000ML POUR BTL (IV SOLUTION) ×3 IMPLANT
PACK LAP CHOLECYSTECTOMY (MISCELLANEOUS) ×3 IMPLANT
POUCH ENDO CATCH 10MM SPEC (MISCELLANEOUS) ×3 IMPLANT
SCISSORS METZENBAUM CVD 33 (INSTRUMENTS) ×3 IMPLANT
SLEEVE ENDOPATH XCEL 5M (ENDOMECHANICALS) ×6 IMPLANT
SUT MNCRL AB 4-0 PS2 18 (SUTURE) ×3 IMPLANT
SUT VICRYL 0 UR6 27IN ABS (SUTURE) ×3 IMPLANT
SUT VICRYL AB 3-0 FS1 BRD 27IN (SUTURE) ×3 IMPLANT
SYR 20CC LL (SYRINGE) IMPLANT
SYR 30ML LL (SYRINGE) ×3 IMPLANT
TROCAR XCEL NON-BLD 11X100MML (ENDOMECHANICALS) ×3 IMPLANT
TROCAR XCEL NON-BLD 5MMX100MML (ENDOMECHANICALS) ×3 IMPLANT
TUBING INSUFFLATION (TUBING) ×3 IMPLANT

## 2017-04-19 NOTE — Discharge Instructions (Addendum)
In addition to included general post-operative instructions for Laparoscopic Cholecystectomy,  Diet: Resume home heart healthy diet.   Activity: No heavy lifting >20 pounds (children, pets, laundry, garbage) or strenuous activity until follow-up, but light activity and walking are encouraged. Do not drive or drink alcohol if taking narcotic pain medications.  Wound care: 2 days after surgery (Wednesday, 9/19), may shower/get incision wet with soapy water and pat dry (do not rub incisions), but no baths or submerging incision underwater until follow-up.   Medications: Resume all home medications. For mild to moderate pain: acetaminophen (Tylenol) or ibuprofen (if no kidney disease). Combining Tylenol with alcohol can substantially increase your risk of causing liver disease. Narcotic pain medications, if prescribed, can be used for severe pain, though may cause nausea, constipation, and drowsiness. Do not combine Tylenol and Percocet within a 6 hour period as Percocet contains Tylenol. If you do not need the narcotic pain medication, you do not need to fill the prescription.  Call office [phone]) at any time if any questions, worsening pain, fevers/chills, bleeding, drainage from incision site, or other concerns.    AMBULATORY SURGERY  DISCHARGE INSTRUCTIONS   1) The drugs that you were given will stay in your system until tomorrow so for the next 24 hours you should not:  A) Drive an automobile B) Make any legal decisions C) Drink any alcoholic beverage   2) You may resume regular meals tomorrow.  Today it is better to start with liquids and gradually work up to solid foods.  You may eat anything you prefer, but it is better to start with liquids, then soup and crackers, and gradually work up to solid foods.   3) Please notify your doctor immediately if you have any unusual bleeding, trouble breathing, redness and pain at the surgery site, drainage, fever, or pain not relieved by  medication.    4) Additional Instructions:        Please contact your physician with any problems or Same Day Surgery at [phone], Monday through Friday 6 am to 4 pm, or [place] [facility] number at [phone].

## 2017-04-19 NOTE — Transfer of Care (Signed)
Immediate Anesthesia Transfer of Care Note  Patient: Katie Leon  Procedure(s) Performed: Procedure(s): LAPAROSCOPIC CHOLECYSTECTOMY (N/A)  Patient Location: PACU  Anesthesia Type:General  Level of Consciousness: awake and alert   Airway & Oxygen Therapy: Patient Spontanous Breathing and Patient connected to face mask oxygen  Post-op Assessment: Report given to RN and Post -op Vital signs reviewed and stable  Post vital signs: Reviewed and stable  Last Vitals:  Vitals:   04/19/17 0736  BP: (!) 147/84  Resp: 18  Temp: 36.5 C  SpO2: 99%    Last Pain:  Vitals:   04/19/17 0736  TempSrc: Tympanic  PainSc: 10-Worst pain ever         Complications: No apparent anesthesia complications

## 2017-04-19 NOTE — Anesthesia Preprocedure Evaluation (Signed)
Anesthesia Evaluation  Patient identified by MRN, date of birth, ID band Patient awake    Reviewed: Allergy & Precautions, H&P , NPO status , Patient's Chart, lab work & pertinent test results, reviewed documented beta blocker date and time   Airway Mallampati: II  TM Distance: >3 FB Neck ROM: full    Dental  (+) Teeth Intact   Pulmonary neg pulmonary ROS,    Pulmonary exam normal        Cardiovascular Exercise Tolerance: Good hypertension, On Medications negative cardio ROS Normal cardiovascular exam Rhythm:regular Rate:Normal     Neuro/Psych negative neurological ROS  negative psych ROS   GI/Hepatic negative GI ROS, Neg liver ROS,   Endo/Other  negative endocrine ROSMorbid obesity  Renal/GU negative Renal ROS  negative genitourinary   Musculoskeletal   Abdominal   Peds  Hematology negative hematology ROS (+)   Anesthesia Other Findings Past Medical History: No date: Arthritis No date: Gallstones No date: Heart disease No date: Hypertension No date: Pancreatitis Past Surgical History: No date: ABDOMINAL HYSTERECTOMY No date: Bilateral knee replacements ; Bilateral No date: CARDIAC CATHETERIZATION 1999: GASTROPLASTY VERTICAL BANDED No date: Hip replacement x2 ; Left No date: JOINT REPLACEMENT No date: Shouulder surgery ; Bilateral     Comment:  Rotator Cuff Repair No date: TONSILLECTOMY     Comment:  Adnoidectomy BMI    Body Mass Index:  48.18 kg/m     Reproductive/Obstetrics negative OB ROS                             Anesthesia Physical Anesthesia Plan  ASA: III  Anesthesia Plan: General ETT   Post-op Pain Management:    Induction:   PONV Risk Score and Plan: 4 or greater and Ondansetron, Dexamethasone, Midazolam and Propofol infusion  Airway Management Planned:   Additional Equipment:   Intra-op Plan:   Post-operative Plan:   Informed Consent: I have  reviewed the patients History and Physical, chart, labs and discussed the procedure including the risks, benefits and alternatives for the proposed anesthesia with the patient or authorized representative who has indicated his/her understanding and acceptance.   Dental Advisory Given  Plan Discussed with: CRNA  Anesthesia Plan Comments:         Anesthesia Quick Evaluation

## 2017-04-19 NOTE — Progress Notes (Signed)
Patient resting comfortably , no pain voiced, arouses To voice easily.

## 2017-04-19 NOTE — Anesthesia Post-op Follow-up Note (Signed)
Anesthesia QCDR form completed.        

## 2017-04-19 NOTE — Progress Notes (Signed)
Patient sleeping very comfortably, when aroused She moans she hurts.  Medication given.
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
## 2017-04-19 NOTE — OR Nursing (Signed)
Discharge pending family arrival for pickup, advises her daughter does not get off work until 3 pm.

## 2017-04-19 NOTE — Interval H&P Note (Signed)
History and Physical Interval Note:  04/19/2017 8:37 AM  Katie Leon  has presented today for surgery, with the diagnosis of biliary colic  The various methods of treatment have been discussed with the patient and family. After consideration of risks, benefits and other options for treatment, the patient has consented to  Procedure(s): LAPAROSCOPIC CHOLECYSTECTOMY WITH INTRAOPERATIVE CHOLANGIOGRAM (N/A) as a surgical intervention .  The patient's history has been reviewed, patient examined, no change in status, stable for surgery.  I have reviewed the patient's chart and labs.  Questions were answered to the patient's satisfaction.     Ancil Linsey

## 2017-04-19 NOTE — Anesthesia Procedure Notes (Signed)
Procedure Name: Intubation Date/Time: 04/19/2017 9:28 AM Performed by: Ginger Carne Pre-anesthesia Checklist: Patient identified, Emergency Drugs available, Suction available, Patient being monitored and Timeout performed Patient Re-evaluated:Patient Re-evaluated prior to induction Oxygen Delivery Method: Circle system utilized Preoxygenation: Pre-oxygenation with 100% oxygen Induction Type: IV induction, Rapid sequence and Cricoid Pressure applied Ventilation: Mask ventilation without difficulty and Oral airway inserted - appropriate to patient size Laryngoscope Size: Miller and 2 Grade View: Grade II Tube type: Oral Tube size: 7.5 mm Number of attempts: 1 Airway Equipment and Method: Stylet Placement Confirmation: ETT inserted through vocal cords under direct vision,  positive ETCO2 and breath sounds checked- equal and bilateral Secured at: 22 cm Tube secured with: Tape Dental Injury: Teeth and Oropharynx as per pre-operative assessment  Difficulty Due To: Difficulty was anticipated, Difficult Airway- due to anterior larynx, Difficult Airway- due to limited oral opening and Difficult Airway- due to large tongue Future Recommendations: Recommend- induction with short-acting agent, and alternative techniques readily available

## 2017-04-19 NOTE — Op Note (Signed)
SURGICAL OPERATIVE REPORT   DATE OF PROCEDURE: 04/19/2017  ATTENDING Surgeon(s): Ancil Linsey, MD  ANESTHESIA: GETA  PRE-OPERATIVE DIAGNOSIS: Gallstone Pancreatitis (K80.21)  POST-OPERATIVE DIAGNOSIS: Gallstone Pancreatitis (K80.21)  PROCEDURE(S): (cpt's: 47562) 1.) Laparoscopic Lysis of Adhesions 2.) Laparoscopic Cholecystectomy  INTRAOPERATIVE FINDINGS: Enormous (17 cm x 6.5 cm) mild-moderately inflamed gallbladder with severe pericholecystic inflammation with rather extensive filmy adhesions to the gallbladder, liver, and anterior abdominal wall, initially obscuring the gallbladder  INTRAOPERATIVE FLUIDS: 1000 mL crystalloid   ESTIMATED BLOOD LOSS: Minimal (<30 mL)   URINE OUTPUT: No foley  SPECIMENS: 17 cm x 6.5 cm gallbladder  IMPLANTS: None  DRAINS: None   COMPLICATIONS: None apparent   CONDITION AT COMPLETION: Hemodynamically stable and extubated  DISPOSITION: PACU   INDICATION(S) FOR PROCEDURE:  Patient is a 61 y.o. female who recently presented to [facility] for abdominal pain, was diagnosed with gallstone pancreatitis, and was discharged home with outpatient surgical follow-up upon resolution of her presenting symptoms, since which she's continued to experience post-prandial RUQ > epigastric abdominal pain. All risks, benefits, and alternatives to cholecystectomy were discussed with the patient, who elected to proceed, and informed consent was accordingly obtained and documented at that time.   DETAILS OF PROCEDURE:  Patient was brought to the operating suite and appropriately identified. General anesthesia was administered along with peri-operative prophylactic IV antibiotics, and endotracheal intubation was performed by anesthesiologist, along with NG/OG tube for gastric decompression. In supine position, operative site was prepped and draped in usual sterile fashion, and following a brief time out, initial 5 mm incision was made in a natural skin crease just  above the umbilicus. Fascia was then elevated, and a Verress needle was inserted and its proper position confirmed using aspiration and saline meniscus test.  Upon insufflation of the abdominal cavity with carbon dioxide to a well-tolerated pressure of 12-15 mmHg, a 5 mm peri-umbilical port followed by laparoscope were inserted and used to inspect the abdominal cavity and its contents with no injuries from insertion of the first trochar noted. Also noted were extensive filmy adhesions to the liver and anterior abdominal wall, initially obscuring visualization of the gallbladder. Three additional trocars were inserted, one at the epigastric position (10 mm) and two along the Right costal margin (5 mm). The table was then placed in reverse Trendelenburg position with the Right side up. Filmy adhesions between the gallbladder and omentum/duodenum/transverse colon and liver were lysed using combined blunt dissection and selective electrocautery. The apex/dome of the very large and fluid-filled gallbladder was grasped with an atraumatic grasper passed through the lateral port and retracted apically over the liver. The infundibulum was also grasped and retracted, exposing Calot's triangle. The peritoneum overlying the gallbladder infundibulum was incised and dissected free of surrounding peritoneal attachments, revealing the cystic duct and cystic artery, which were clipped twice on the patient side and once on the gallbladder specimen side close to the gallbladder. The gallbladder was then dissected from its peritoneal attachments to the liver using electrocautery, during which a small capsular tear of the liver was incurred with a diffusely raw but not overtly bleeding surface. The gallbladder was then barely fit into a laparoscopic specimen bag and removed from the abdominal cavity via the epigastric port site. Hemostasis and secure placement of clips were confirmed, and intra-peritoneal cavity was inspected with no  additional findings. Arista was applied to the raw non-bleeding surface of the gallbladder fossa of the liver bed, and PMI laparoscopic fascial closure device was then used to re-approximate fascia  at the 10 mm epigastric port site.  All ports were then removed under direct visualization, and abdominal cavity was desuflated. All port sites were irrigated/cleaned, additional local anesthetic was injected at each incision, 3-0 Vicryl was used to re-approximate dermis at 10 mm port site(s), and subcuticular 4-0 Monocryl suture was used to re-approximate skin. Skin was then cleaned, dried, and sterile skin glue was applied. Patient was then safely able to be awakened, extubated, and transferred to PACU for post-operative monitoring and care.   I was present for all aspects of procedure, and there were no intra-operative complications apparent.

## 2017-04-20 ENCOUNTER — Telehealth: Payer: Self-pay

## 2017-04-20 LAB — SURGICAL PATHOLOGY

## 2017-04-20 NOTE — Anesthesia Postprocedure Evaluation (Signed)
Anesthesia Post Note  Patient: Katie Leon  Procedure(s) Performed: Procedure(s) (LRB): LAPAROSCOPIC CHOLECYSTECTOMY (N/A)  Patient location during evaluation: PACU Anesthesia Type: General Level of consciousness: awake and alert Pain management: pain level controlled Vital Signs Assessment: post-procedure vital signs reviewed and stable Respiratory status: spontaneous breathing, nonlabored ventilation, respiratory function stable and patient connected to nasal cannula oxygen Cardiovascular status: blood pressure returned to baseline and stable Postop Assessment: no apparent nausea or vomiting Anesthetic complications: no     Last Vitals:  Vitals:   04/19/17 1226 04/19/17 1404  BP: (!) 177/82 (!) 141/75  Pulse: 70 (!) 52  Resp: 20 18  Temp: (!) 35.8 C 36.6 C  SpO2: 95% 97%    Last Pain:  Vitals:   04/19/17 1404  TempSrc: Temporal  PainSc:                  Yevette Edwards

## 2017-04-20 NOTE — Telephone Encounter (Signed)
Post-op call made to patient at this time. Spoke with Katie Leon. Post-op interview questions below.  1. How are you feeling? good  2. Is your pain controlled? yes  3. What are you doing for the pain? Taking pain meds  4. Are you having any Nausea or Vomiting?gingerale, crachers  5. Are you having any Fever or Chills? no  6. Are you having any Constipation or Diarrhea?  7. Is there any Swelling or Bruising you are concerned about? A little bruising.    Patient express her gratitude for all the excellent care she has received through this ordeal.   8. Do you have any questions or concerns at this time? none   Discussion: Post op appointment discussed and patient verbalized understanding.

## 2017-04-21 ENCOUNTER — Telehealth: Payer: Self-pay

## 2017-04-21 NOTE — Telephone Encounter (Signed)
Post-op call made to patient at this time. Spoke with Katie Leon. Post-op interview questions below.  1. How are you feeling? Feeling okay  2. Is your pain controlled? Yes  3. What are you doing for the pain? Taking oxycodone every four hours was advised to try some tylenol and ibuprofen in between taking pain medication  4. Are you having any Nausea or Vomiting? none  5. Are you having any Fever or Chills? none  6. Are you having any Constipation or Diarrhea? Has yet to have a bowel movement has been passing gas was advised to try otc miralax with at least 72 ounces of water   7. Is there any Swelling or Bruising you are concerned about? None  8. Do you have any questions or concerns at this time? None   Discussion: Reminded of post op appointment at 9:45AM with Dr. Everlene Farrier

## 2017-05-03 ENCOUNTER — Encounter: Payer: Self-pay | Admitting: Surgery

## 2017-05-03 ENCOUNTER — Ambulatory Visit (INDEPENDENT_AMBULATORY_CARE_PROVIDER_SITE_OTHER): Payer: Medicare Other | Admitting: Surgery

## 2017-05-03 VITALS — BP 140/99 | HR 69 | Temp 98.2°F | Ht 63.0 in | Wt 264.6 lb

## 2017-05-03 DIAGNOSIS — Z09 Encounter for follow-up examination after completed treatment for conditions other than malignant neoplasm: Secondary | ICD-10-CM

## 2017-05-03 NOTE — Patient Instructions (Signed)

## 2017-05-03 NOTE — Progress Notes (Signed)
S/p lap chole by Dr. Earlene Plater 9/17 Doing well Minimal pain Taking Po Some nausea, + BM  PE NAD Abd: soft, Nt, incisions c/d/i, no infection  A/p Doing well No apparent surgical complications RTC prn

## 2017-07-13 ENCOUNTER — Ambulatory Visit: Payer: Medicare Other | Admitting: Nurse Practitioner

## 2017-07-26 ENCOUNTER — Encounter: Payer: Self-pay | Admitting: Surgery

## 2017-07-26 ENCOUNTER — Ambulatory Visit (INDEPENDENT_AMBULATORY_CARE_PROVIDER_SITE_OTHER): Payer: Medicare Other | Admitting: Surgery

## 2017-07-26 VITALS — BP 138/84 | HR 54 | Temp 98.3°F | Ht 63.0 in | Wt 270.0 lb

## 2017-07-26 DIAGNOSIS — R1032 Left lower quadrant pain: Secondary | ICD-10-CM

## 2017-07-26 NOTE — Patient Instructions (Signed)
Please go and see your primary care doctor and let him know that you have been having LLQ pain x 2 months.

## 2017-07-26 NOTE — Progress Notes (Signed)
Surgical Clinic Progress/Follow-up Note   HPI:  61 y.o. Female presents to clinic for evaluation of LLQ abdominal and Left flank pain since mid-October (x2 months). Patient reports her pain began more than 1 month following her laparoscopic cholecystectomy with laparoscopic lysis of adhesions, and she describes she has felt well otherwise with advancement of her diet well-tolerated and regular formed daily BM's without straining or difficulty. She denies any N/V or fever/chills during this time and recalls her last colonoscopy was ~5 years ago, adding that she is very diligent regarding colonoscopies considering her father died of colon cancer. Patient says she is unable to recall any trauma, and she denies any RUQ, epigastric, or umbilical pain or discomfort.  Review of Systems:  Constitutional: denies any other weight loss, fever, chills, or sweats  Eyes: denies any other vision changes, history of eye injury  ENT: denies sore throat, hearing problems  Respiratory: denies shortness of breath, wheezing  Cardiovascular: denies chest pain, palpitations  Gastrointestinal: abdominal pain, N/V, and bowel function as per HPI Musculoskeletal: denies any other joint pains or cramps  Skin: Denies any other rashes or skin discolorations  Neurological: denies any other headache, dizziness, weakness  Psychiatric: denies any other depression, anxiety  All other review of systems: otherwise negative   Vital Signs:  BP 138/84   Pulse (!) 54   Temp 98.3 F (36.8 C) (Oral)   Ht 5\' 3"  (1.6 m)   Wt 270 lb (122.5 kg)   BMI 47.83 kg/m    Physical Exam:  Constitutional:  -- Obese body habitus  -- Awake, alert, and oriented x3  Eyes:  -- Pupils equally round and reactive to light  -- No scleral icterus  Ear, nose, throat:  -- No jugular venous distension  -- No nasal drainage, bleeding Pulmonary:  -- No crackles -- Equal breath sounds bilaterally -- Breathing non-labored at  rest Cardiovascular:  -- S1, S2 present  -- No pericardial rubs  Gastrointestinal:  -- Soft and non-distended with mild-/moderate- very focal LLQ tenderness to palpation extending somewhat to patient's Left flank, no guarding/rebound  -- Former RUQ, epigastric, and umbilical laparoscopic port site incisions are all very well-healed and completely non-tender with no appreciable hernias -- No abdominal masses appreciated, pulsatile or otherwise  Musculoskeletal / Integumentary:  -- Wounds or skin discoloration: None appreciated except as described above (GI)  -- Extremities: B/L UE and LE FROM, hands and feet warm Neurologic:  -- Motor function: intact and symmetric  -- Sensation: intact and symmetric   Assessment:  61 y.o. yo Female with a problem list including...  Patient Active Problem List   Diagnosis Date Noted  . Biliary colic 04/14/2017  . Heart disease 04/09/2017  . Gallstone pancreatitis 03/29/2017  . Gallstones 03/29/2017  . HTN (hypertension) 03/29/2017    presents to clinic for evaluation of occasional LLQ abdominal pain >3 months s/p laparoscopic cholecystectomy with lysis of post-surgical adhesions, otherwise doing quite well.  Plan:   - continue regular diet and activity without restrictions  - unable to appreciate any relationship to prior laparoscopic cholecystectomy and no evidence of bowel obstruction, hernias, or diverticulitis   - outpatient follow-up with patient's primary medical physician in January as scheduled, may consider CT to evaluate for hernia, spine if persists  - return to clinic as needed, instructed to call office if any questions or concerns  All of the above recommendations were discussed with the patient, and all of patient's questions were answered to her expressed satisfaction.  --  Marilynne Drivers. Rosana Hoes, MD, [place]: [facility] - Partnering for exceptional care. Office: [phone]

## 2017-08-21 ENCOUNTER — Encounter: Payer: Self-pay | Admitting: Emergency Medicine

## 2017-08-21 ENCOUNTER — Emergency Department
Admission: EM | Admit: 2017-08-21 | Discharge: 2017-08-21 | Disposition: A | Discharge status: Home / Self Care | Payer: Medicare Other | Attending: Emergency Medicine | Admitting: Emergency Medicine

## 2017-08-21 ENCOUNTER — Other Ambulatory Visit: Payer: Self-pay

## 2017-08-21 DIAGNOSIS — Z9049 Acquired absence of other specified parts of digestive tract: Secondary | ICD-10-CM | POA: Diagnosis not present

## 2017-08-21 DIAGNOSIS — R21 Rash and other nonspecific skin eruption: Secondary | ICD-10-CM | POA: Diagnosis not present

## 2017-08-21 DIAGNOSIS — I10 Essential (primary) hypertension: Secondary | ICD-10-CM | POA: Diagnosis not present

## 2017-08-21 DIAGNOSIS — H02843 Edema of right eye, unspecified eyelid: Secondary | ICD-10-CM | POA: Diagnosis present

## 2017-08-21 LAB — CBC WITH DIFFERENTIAL/PLATELET
BASOS PCT: 1 %
Basophils Absolute: 0.1 10*3/uL (ref 0–0.1)
EOS ABS: 0.4 10*3/uL (ref 0–0.7)
EOS PCT: 5 %
HCT: 39.5 % (ref 35.0–47.0)
HEMOGLOBIN: 13 g/dL (ref 12.0–16.0)
LYMPHS ABS: 1.5 10*3/uL (ref 1.0–3.6)
Lymphocytes Relative: 22 %
MCH: 27.3 pg (ref 26.0–34.0)
MCHC: 33 g/dL (ref 32.0–36.0)
MCV: 82.7 fL (ref 80.0–100.0)
MONOS PCT: 10 %
Monocytes Absolute: 0.7 10*3/uL (ref 0.2–0.9)
NEUTROS PCT: 62 %
Neutro Abs: 4.4 10*3/uL (ref 1.4–6.5)
PLATELETS: 214 10*3/uL (ref 150–440)
RBC: 4.77 MIL/uL (ref 3.80–5.20)
RDW: 14.6 % — ABNORMAL HIGH (ref 11.5–14.5)
WBC: 7.1 10*3/uL (ref 3.6–11.0)

## 2017-08-21 LAB — COMPREHENSIVE METABOLIC PANEL
ALBUMIN: 4.3 g/dL (ref 3.5–5.0)
ALT: 13 U/L — ABNORMAL LOW (ref 14–54)
ANION GAP: 11 (ref 5–15)
AST: 26 U/L (ref 15–41)
Alkaline Phosphatase: 131 U/L — ABNORMAL HIGH (ref 38–126)
BUN: 20 mg/dL (ref 6–20)
CALCIUM: 9.5 mg/dL (ref 8.9–10.3)
CHLORIDE: 101 mmol/L (ref 101–111)
CO2: 24 mmol/L (ref 22–32)
Creatinine, Ser: 0.83 mg/dL (ref 0.44–1.00)
GFR calc non Af Amer: >60 mL/min (ref >60)
GLUCOSE: 106 mg/dL — AB (ref 65–99)
POTASSIUM: 3.5 mmol/L (ref 3.5–5.1)
SODIUM: 136 mmol/L (ref 135–145)
Total Bilirubin: 0.6 mg/dL (ref 0.3–1.2)
Total Protein: 7.8 g/dL (ref 6.5–8.1)

## 2017-08-21 MED ORDER — PREDNISONE 10 MG (21) PO TBPK: ORAL_TABLET | Freq: Every day | ORAL | 0 refills | Status: DC

## 2017-08-21 MED ORDER — DIPHENHYDRAMINE HCL 50 MG/ML IJ SOLN
25.0000 mg | Freq: Once | INTRAMUSCULAR | Status: AC
  Administered 2017-08-21: 25 mg via INTRAVENOUS
  Filled 2017-08-21: qty 1

## 2017-08-21 MED ORDER — DIPHENHYDRAMINE-ZINC ACETATE 2-0.1 % EX CREA: 1.0000 "application " | TOPICAL_CREAM | Freq: Three times a day (TID) | CUTANEOUS | 0 refills | Status: DC | PRN

## 2017-08-21 MED ORDER — METHYLPREDNISOLONE SODIUM SUCC 125 MG IJ SOLR
125.0000 mg | Freq: Once | INTRAMUSCULAR | Status: AC
  Administered 2017-08-21: 125 mg via INTRAVENOUS
  Filled 2017-08-21: qty 2

## 2017-08-21 MED ORDER — OXYCODONE-ACETAMINOPHEN 5-325 MG PO TABS
2.0000 | ORAL_TABLET | Freq: Once | ORAL | Status: AC
  Administered 2017-08-21: 2 via ORAL
  Filled 2017-08-21: qty 2

## 2017-08-21 MED ORDER — CEPHALEXIN 500 MG PO CAPS: 500.0000 mg | ORAL_CAPSULE | Freq: Four times a day (QID) | ORAL | 0 refills | Status: AC

## 2017-08-21 NOTE — ED Triage Notes (Signed)
Pt states that on Wednesday her right eye lid started to itch and swell up. At this time, pt has swelling noted to both eyes and cheeks. Pt reports that she has tried benadryl and other medications. Pt denies any new medications or products at this time. Pt is in NAD.

## 2017-08-21 NOTE — ED Notes (Signed)
Registration at bedside.

## 2017-08-21 NOTE — ED Notes (Signed)
Patient out to lobby to wait for ride. First nurse informed.

## 2017-08-21 NOTE — ED Notes (Signed)
ED Provider at bedside. 

## 2017-08-21 NOTE — ED Notes (Signed)
Patient reports decrease in facial discomfort/swelling of left eye.

## 2017-08-21 NOTE — ED Notes (Signed)
Pt presents with allergic reaction to unknown substance; redness and hives to cheeks and around her eyes; denies oral swelling; denies difficulty breathing or swallowing;

## 2017-08-21 NOTE — ED Notes (Signed)
Reviewed discharge instructions, follow-up care, and prescriptions with patient. Patient verbalized understanding of all information reviewed. Patient stable, with no distress noted at this time.    

## 2017-08-21 NOTE — ED Provider Notes (Signed)
[facility] Emergency Department Provider Note       Time seen: ----------------------------------------- 8:39 PM on 08/21/2017 -----------------------------------------   I have reviewed the triage vital signs and the nursing notes.  HISTORY   Chief Complaint Allergic Reaction    HPI Katie Leon is a 62 y.o. female with a history of arthritis, heart disease, hypertension and pancreatitis who presents to the ED for possible allergic reaction.  Patient states on Wednesday her right eyelid started to itch and swell up.  Currently she is having swelling to both eyes and cheeks.  She reports she is tried Benadryl and other medications.  She is not sure was causing this reaction.  She states this started after she scratched her right eyelid.  She has had itching around both eyes and eye drainage.  Past Medical History:  Diagnosis Date  . Arthritis   . Gallstones   . Heart disease   . Hypertension   . Pancreatitis     Patient Active Problem List   Diagnosis Date Noted  . Biliary colic 04/14/2017  . Heart disease 04/09/2017  . Gallstone pancreatitis 03/29/2017  . Gallstones 03/29/2017  . HTN (hypertension) 03/29/2017    Past Surgical History:  Procedure Laterality Date  . ABDOMINAL HYSTERECTOMY    . Bilateral knee replacements  Bilateral   . CARDIAC CATHETERIZATION    . CHOLECYSTECTOMY N/A 04/19/2017   Procedure: LAPAROSCOPIC CHOLECYSTECTOMY;  Surgeon: Ancil Linseyavis, Jason Evan, MD;  Location: [facility] ORS;  Service: General;  Laterality: N/A;  . GASTROPLASTY VERTICAL BANDED  1999  . Hip replacement x2  Left   . JOINT REPLACEMENT    . Shouulder surgery  Bilateral    Rotator Cuff Repair  . TONSILLECTOMY     Adnoidectomy    Allergies Patient has no known allergies.  Social History Social History   Tobacco Use  . Smoking status: Never Smoker  . Smokeless tobacco: Never Used  Substance Use Topics  . Alcohol use: No  . Drug use: No   Review of  Systems Constitutional: Negative for fever. Eyes: Negative for vision changes ENT:  Negative for congestion, sore throat Cardiovascular: Negative for chest pain. Respiratory: Negative for shortness of breath. Gastrointestinal: Negative for abdominal pain, vomiting and diarrhea. Musculoskeletal: Negative for back pain. Skin: Positive for rash and itching Neurological: Negative for headaches, focal weakness or numbness.  All systems negative/normal/unremarkable except as stated in the HPI  ____________________________________________   PHYSICAL EXAM:  VITAL SIGNS: ED Triage Vitals [08/21/17 2028]  Enc Vitals Group     BP (!) 206/121     Pulse Rate 78     Resp 18     Temp 98.9 F (37.2 C)     Temp Source Oral     SpO2 100 %     Weight 270 lb (122.5 kg)     Height 5\' 3"  (1.6 m)     Head Circumference      Peak Flow      Pain Score 10     Pain Loc      Pain Edu?      Excl. in GC?     Constitutional: Alert and oriented. Well appearing and in no distress. Eyes: Conjunctival injection bilaterally.  Bilateral periorbital edema and erythema ENT   Head: Normocephalic and atraumatic.   Nose: No congestion/rhinnorhea.   Mouth/Throat: Mucous membranes are moist.   Neck: No stridor. Cardiovascular: Normal rate, regular rhythm. No murmurs, rubs, or gallops. Respiratory: Normal respiratory effort without tachypnea  nor retractions. Breath sounds are clear and equal bilaterally. No wheezes/rales/rhonchi. Gastrointestinal: Soft and nontender. Normal bowel sounds Musculoskeletal: Nontender with normal range of motion in extremities. No lower extremity tenderness nor edema. Neurologic:  Normal speech and language. No gross focal neurologic deficits are appreciated.  Skin: Extensive facial erythema and edema around the eyes and nose.  This appears to be symmetric. Psychiatric: Mood and affect are normal. Speech and behavior are normal.   ____________________________________________  ED COURSE:  As part of my medical decision making, I reviewed the following data within the electronic MEDICAL RECORD NUMBER History obtained from family if available, nursing notes, old chart and ekg, as well as notes from prior ED visits. Patient presented for possible allergic reaction, we will assess with labs, give IV steroids, Benadryl and reevaluate.  It is possible this is erysipelas.   Procedures ____________________________________________   LABS (pertinent positives/negatives)  Labs Reviewed  CBC WITH DIFFERENTIAL/PLATELET - Abnormal; Notable for the following components:      Result Value   RDW 14.6 (*)    All other components within normal limits  COMPREHENSIVE METABOLIC PANEL - Abnormal; Notable for the following components:   Glucose, Bld 106 (*)    ALT 13 (*)    Alkaline Phosphatase 131 (*)    All other components within normal limits  ____________________________________________  DIFFERENTIAL DIAGNOSIS   Allergic reaction, erysipelas, impetigo, medication side effect, allergies  FINAL ASSESSMENT AND PLAN  Rash   Plan: Patient had presented for facial rash and itching. Patient's labs are reassuring.  It is unclear if this rash is more infectious or allergic.  We will treat with steroids and antibiotics as well as topical Benadryl cream.  Otherwise she is stable for outpatient follow-up.Emily Filbert, MD   Note: This note was generated in part or whole with voice recognition software. Voice recognition is usually quite accurate but there are transcription errors that can and very often do occur. I apologize for any typographical errors that were not detected and corrected.     Emily Filbert, MD 08/21/17 2241

## 2017-10-01 ENCOUNTER — Other Ambulatory Visit: Payer: Self-pay | Admitting: Family Medicine

## 2017-10-01 DIAGNOSIS — Z1231 Encounter for screening mammogram for malignant neoplasm of breast: Secondary | ICD-10-CM

## 2017-10-12 ENCOUNTER — Other Ambulatory Visit: Payer: Self-pay | Admitting: Family Medicine

## 2017-10-12 DIAGNOSIS — R748 Abnormal levels of other serum enzymes: Secondary | ICD-10-CM

## 2017-10-12 DIAGNOSIS — E7800 Pure hypercholesterolemia, unspecified: Secondary | ICD-10-CM

## 2017-10-15 ENCOUNTER — Ambulatory Visit
Admission: RE | Admit: 2017-10-15 | Discharge: 2017-10-15 | Disposition: A | Discharge status: Home / Self Care | Payer: Medicare Other | Source: Ambulatory Visit | Attending: Family Medicine | Admitting: Family Medicine

## 2017-10-15 DIAGNOSIS — E7800 Pure hypercholesterolemia, unspecified: Secondary | ICD-10-CM | POA: Insufficient documentation

## 2017-10-15 DIAGNOSIS — R748 Abnormal levels of other serum enzymes: Secondary | ICD-10-CM | POA: Insufficient documentation

## 2017-10-15 DIAGNOSIS — K7689 Other specified diseases of liver: Secondary | ICD-10-CM | POA: Diagnosis not present

## 2017-10-15 DIAGNOSIS — Z9049 Acquired absence of other specified parts of digestive tract: Secondary | ICD-10-CM | POA: Diagnosis not present

## 2017-11-26 LAB — HIV ANTIBODY (ROUTINE TESTING W REFLEX): HIV Screen 4th Generation wRfx: NONREACTIVE

## 2018-01-05 ENCOUNTER — Ambulatory Visit
Admission: RE | Admit: 2018-01-05 | Discharge: 2018-01-05 | Disposition: A | Discharge status: Home / Self Care | Payer: Medicare Other | Source: Ambulatory Visit | Attending: Family Medicine | Admitting: Family Medicine

## 2018-01-05 DIAGNOSIS — Z1231 Encounter for screening mammogram for malignant neoplasm of breast: Secondary | ICD-10-CM

## 2018-05-17 ENCOUNTER — Other Ambulatory Visit (HOSPITAL_COMMUNITY): Payer: Self-pay | Admitting: Addiction Medicine

## 2018-05-17 DIAGNOSIS — R609 Edema, unspecified: Secondary | ICD-10-CM

## 2018-05-17 DIAGNOSIS — R1310 Dysphagia, unspecified: Secondary | ICD-10-CM

## 2018-05-17 DIAGNOSIS — J189 Pneumonia, unspecified organism: Secondary | ICD-10-CM

## 2018-05-17 DIAGNOSIS — I509 Heart failure, unspecified: Secondary | ICD-10-CM

## 2018-05-18 ENCOUNTER — Observation Stay
Admission: EM | Admit: 2018-05-18 | Discharge: 2018-05-20 | Disposition: A | Discharge status: Home / Self Care | Payer: Medicare Other | Attending: Internal Medicine | Admitting: Internal Medicine

## 2018-05-18 ENCOUNTER — Encounter: Payer: Self-pay | Admitting: *Deleted

## 2018-05-18 ENCOUNTER — Emergency Department: Payer: Medicare Other

## 2018-05-18 ENCOUNTER — Other Ambulatory Visit: Payer: Self-pay | Admitting: Addiction Medicine

## 2018-05-18 ENCOUNTER — Other Ambulatory Visit: Payer: Self-pay

## 2018-05-18 DIAGNOSIS — Z79899 Other long term (current) drug therapy: Secondary | ICD-10-CM | POA: Diagnosis not present

## 2018-05-18 DIAGNOSIS — R109 Unspecified abdominal pain: Secondary | ICD-10-CM | POA: Diagnosis present

## 2018-05-18 DIAGNOSIS — R609 Edema, unspecified: Secondary | ICD-10-CM

## 2018-05-18 DIAGNOSIS — N170 Acute kidney failure with tubular necrosis: Principal | ICD-10-CM | POA: Insufficient documentation

## 2018-05-18 DIAGNOSIS — Z96653 Presence of artificial knee joint, bilateral: Secondary | ICD-10-CM | POA: Diagnosis not present

## 2018-05-18 DIAGNOSIS — I119 Hypertensive heart disease without heart failure: Secondary | ICD-10-CM | POA: Diagnosis not present

## 2018-05-18 DIAGNOSIS — Z9049 Acquired absence of other specified parts of digestive tract: Secondary | ICD-10-CM | POA: Diagnosis not present

## 2018-05-18 DIAGNOSIS — N179 Acute kidney failure, unspecified: Secondary | ICD-10-CM | POA: Diagnosis present

## 2018-05-18 DIAGNOSIS — Z9884 Bariatric surgery status: Secondary | ICD-10-CM | POA: Insufficient documentation

## 2018-05-18 LAB — CBC
HCT: 39.3 % (ref 36.0–46.0)
Hemoglobin: 12.6 g/dL (ref 12.0–15.0)
MCH: 28.6 pg (ref 26.0–34.0)
MCHC: 32.1 g/dL (ref 30.0–36.0)
MCV: 89.3 fL (ref 80.0–100.0)
PLATELETS: 277 10*3/uL (ref 150–400)
RBC: 4.4 MIL/uL (ref 3.87–5.11)
RDW: 14.9 % (ref 11.5–15.5)
WBC: 11.9 10*3/uL — ABNORMAL HIGH (ref 4.0–10.5)
nRBC: 0 % (ref 0.0–0.2)

## 2018-05-18 LAB — BASIC METABOLIC PANEL
ANION GAP: 13 (ref 5–15)
BUN: 39 mg/dL — ABNORMAL HIGH (ref 8–23)
CALCIUM: 9.4 mg/dL (ref 8.9–10.3)
CO2: 26 mmol/L (ref 22–32)
Chloride: 100 mmol/L (ref 98–111)
Creatinine, Ser: 2.27 mg/dL — ABNORMAL HIGH (ref 0.44–1.00)
GFR calc Af Amer: 26 mL/min — ABNORMAL LOW (ref >60)
GFR calc non Af Amer: 22 mL/min — ABNORMAL LOW (ref >60)
GLUCOSE: 98 mg/dL (ref 70–99)
Potassium: 3.7 mmol/L (ref 3.5–5.1)
Sodium: 139 mmol/L (ref 135–145)

## 2018-05-18 LAB — TROPONIN I: Troponin I: <0.03 ng/mL (ref <0.03)

## 2018-05-18 LAB — CK: Total CK: 263 U/L — ABNORMAL HIGH (ref 38–234)

## 2018-05-18 LAB — LIPASE, BLOOD: Lipase: 58 U/L — ABNORMAL HIGH (ref 11–51)

## 2018-05-18 MED ORDER — SODIUM CHLORIDE 0.9 % IV BOLUS
1000.0000 mL | Freq: Once | INTRAVENOUS | Status: AC
  Administered 2018-05-19: 1000 mL via INTRAVENOUS

## 2018-05-18 MED ORDER — SODIUM CHLORIDE 0.9 % IV BOLUS: 500.0000 mL | Freq: Once | INTRAVENOUS | Status: DC

## 2018-05-18 MED ORDER — HYDROCODONE-ACETAMINOPHEN 5-325 MG PO TABS
2.0000 | ORAL_TABLET | Freq: Once | ORAL | Status: AC
  Administered 2018-05-18: 2 via ORAL
  Filled 2018-05-18: qty 2

## 2018-05-18 NOTE — ED Notes (Signed)
Patient transported to X-ray 

## 2018-05-18 NOTE — ED Notes (Signed)
Patient transported to CT 

## 2018-05-18 NOTE — ED Provider Notes (Signed)
[facility] Emergency Department Provider Note ____________________________________________   First MD Initiated Contact with Patient 05/18/18 2155     (approximate)  I have reviewed the triage vital signs and the nursing notes.   HISTORY  Chief Complaint Neck Pain    HPI Katie Leon is a 62 y.o. female reports discomfort evaluation for pain in her left flank and mid back.  Reports that she is been having pain in her left flank and back that radiates up and down her spine to the point occasionally will feel pain all the way up to her neck.  She reports is been associated with noting that her urine has looked foamy.  She also reports the pain is predominantly in her mid to lower left back.  She reports she had similar symptoms of discomfort with pneumonia, but this does seem lower and she feels like there is some type of abdominal pain and bloating associated.  Discomfort and pain is been ongoing for several days now, at least 3-4.  No fevers or chills.  No vomiting but some occasional nausea at times, none now.  Foamy appearing urine but no abnormal odor or discomfort with urination  She does report she has a strong family history of kidney problems as well.  No shortness of breath no cough and denies any symptoms or concerns about her lungs except she has had pneumonia in the past many times with a similar discomfort but this seems to be lower along her left flank   Past Medical History:  Diagnosis Date  . Arthritis   . Gallstones   . Heart disease   . Hypertension   . Pancreatitis     Patient Active Problem List   Diagnosis Date Noted  . Biliary colic 04/14/2017  . Heart disease 04/09/2017  . Gallstone pancreatitis 03/29/2017  . Gallstones 03/29/2017  . HTN (hypertension) 03/29/2017    Past Surgical History:  Procedure Laterality Date  . ABDOMINAL HYSTERECTOMY    . Bilateral knee replacements  Bilateral   . CARDIAC CATHETERIZATION    .  CHOLECYSTECTOMY N/A 04/19/2017   Procedure: LAPAROSCOPIC CHOLECYSTECTOMY;  Surgeon: Ancil Linsey, MD;  Location: [facility] ORS;  Service: General;  Laterality: N/A;  . GASTROPLASTY VERTICAL BANDED  1999  . Hip replacement x2  Left   . JOINT REPLACEMENT    . Shouulder surgery  Bilateral    Rotator Cuff Repair  . TONSILLECTOMY     Adnoidectomy    Prior to Admission medications   Medication Sig Start Date End Date Taking? Authorizing Provider  AFLURIA QUADRIVALENT injection  04/28/17   [provider]  diltiazem (CARDIZEM CD) 300 MG 24 hr capsule Take 300 mg by mouth daily. 02/22/17   [provider]  diphenhydrAMINE-zinc acetate (BENADRYL EXTRA STRENGTH) cream Apply 1 application topically 3 (three) times daily as needed for itching. 08/21/17   Emily Filbert, MD  meloxicam (MOBIC) 15 MG tablet Take 15 mg by mouth daily. 03/23/17   [provider]  predniSONE (STERAPRED UNI-PAK 21 TAB) 10 MG (21) TBPK tablet Take by mouth daily. Dispense steroid taper pack as directed 08/21/17   Emily Filbert, MD  triamterene-hydrochlorothiazide (MAXZIDE-25) 37.5-25 MG tablet Take 1 tablet by mouth daily. 03/23/17   [provider]    Allergies Patient has no known allergies.  Family History  Problem Relation Age of Onset  . Diabetes Father   . Diabetes Brother   . Breast cancer Neg Hx  Social History Social History   Tobacco Use  . Smoking status: Never Smoker  . Smokeless tobacco: Never Used  Substance Use Topics  . Alcohol use: No  . Drug use: No    Review of Systems Constitutional: No fever/chills Eyes: No visual changes. ENT: No sore throat. Cardiovascular: Denies chest pain. Respiratory: Denies shortness of breath. Gastrointestinal: See HPI Genitourinary: Negative for dysuria.  Previous hysterectomy.  See HPI Musculoskeletal: Negative for back pain except along the left lower flank. Skin: Negative for rash. Neurological: Negative for  headaches, areas of focal weakness or numbness.    ____________________________________________   PHYSICAL EXAM:  VITAL SIGNS: ED Triage Vitals  Enc Vitals Group     BP 05/18/18 1939 118/73     Pulse Rate 05/18/18 1939 73     Resp 05/18/18 1939 18     Temp 05/18/18 1939 98.1 F (36.7 C)     Temp Source 05/18/18 1939 Oral     SpO2 05/18/18 1939 98 %     Weight 05/18/18 1940 284 lb (128.8 kg)     Height 05/18/18 1940 5\' 3"  (1.6 m)     Head Circumference --      Peak Flow --      Pain Score 05/18/18 1939 10     Pain Loc --      Pain Edu? --      Excl. in GC? --     Constitutional: Alert and oriented. Well appearing and in no acute distress.  She is very pleasant, appears in some discomfort reports is worse when she moves about and it is located predominately in her left lower flank Eyes: Conjunctivae are normal. Head: Atraumatic. Nose: No congestion/rhinnorhea. Mouth/Throat: Mucous membranes are moist. Neck: No stridor.  Cardiovascular: Normal rate, regular rhythm. Grossly normal heart sounds.  Good peripheral circulation. Respiratory: Normal respiratory effort.  No retractions. Lungs CTAB. Gastrointestinal: Soft and mildly tender along the left flank, there is moderate left CVA tenderness but no right CVA tenderness. No distention. Musculoskeletal: No lower extremity tenderness nor edema. Neurologic:  Normal speech and language. No gross focal neurologic deficits are appreciated.  Skin:  Skin is warm, dry and intact. No rash noted. Psychiatric: Mood and affect are normal. Speech and behavior are normal.  ____________________________________________   LABS (all labs ordered are listed, but only abnormal results are displayed)  Labs Reviewed  BASIC METABOLIC PANEL - Abnormal; Notable for the following components:      Result Value   BUN 39 (*)    Creatinine, Ser 2.27 (*)    GFR calc non Af Amer 22 (*)    GFR calc Af Amer 26 (*)    All other components within normal  limits  CBC - Abnormal; Notable for the following components:   WBC 11.9 (*)    All other components within normal limits  LIPASE, BLOOD - Abnormal; Notable for the following components:   Lipase 58 (*)    All other components within normal limits  CK - Abnormal; Notable for the following components:   Total CK 263 (*)    All other components within normal limits  TROPONIN I  URINALYSIS, COMPLETE (UACMP) WITH MICROSCOPIC   ____________________________________________  EKG  Reviewed enterotomy at 1945 Heart rate 70 QRS 89 QTC 442 Normal sinus rhythm, possible LVH, no evidence of acute ischemia ____________________________________________  RADIOLOGY  Dg Chest 2 View  Result Date: 05/18/2018 CLINICAL DATA:  Back pain EXAM: CHEST - 2 VIEW COMPARISON:  None. FINDINGS: Upper  normal heart size. Normal vascularity. Low lung volumes. Bibasilar atelectasis. No pneumothorax or pleural effusion. IMPRESSION: Bibasilar atelectasis. Electronically Signed   By: Jolaine Click M.D.   On: 05/18/2018 20:26   Ct Renal Stone Study  Result Date: 05/18/2018 CLINICAL DATA:  Flank pain, stone disease suspected. EXAM: CT ABDOMEN AND PELVIS WITHOUT CONTRAST TECHNIQUE: Multidetector CT imaging of the abdomen and pelvis was performed following the standard protocol without IV contrast. COMPARISON:  None. FINDINGS: Lower chest: Stable cardiomegaly without pericardial effusion. Subsegmental atelectasis and/or scarring at each lung base. Hepatobiliary: Cholecystectomy. Homogeneous appearance of the liver without space-occupying mass or biliary dilatation. Pancreas: Unremarkable. No pancreatic ductal dilatation or surrounding inflammatory changes. Spleen: Normal Adrenals/Urinary Tract: No adrenal mass. No nephrolithiasis nor obstructive uropathy. The urinary bladder is unremarkable for the degree of distention. Stomach/Bowel: Status post bariatric surgery without abnormal dilatation of the excluded stomach. Moderate  stool retention within the right colon. No small bowel dilatation. Normal appearance of the appendix. Vascular/Lymphatic: No significant vascular findings are present. No enlarged abdominal or pelvic lymph nodes. Reproductive: Status post hysterectomy. No adnexal masses. Other: Small periumbilical fat containing hernia. No free air nor free fluid. Musculoskeletal: Grade 1 anterolisthesis of L4 on L5 likely on the basis of degenerative disc and facet arthropathy. Joint space narrowing, facet sclerosis and hypertrophy is identified from L4 through S1. No acute nor aggressive osseous lesions. IMPRESSION: 1. No nephrolithiasis nor obstructive uropathy. 2. Moderate stool retention within the right colon without bowel obstruction or inflammation. 3. Status post bariatric surgery without complicating features. 4. Grade 1 anterolisthesis of L4 on L5 with L4 through S1 facet arthropathy. Electronically Signed   By: Tollie Eth M.D.   On: 05/18/2018 23:00     CT scan result reviewed by me.  ____________________________________________   PROCEDURES  Procedure(s) performed: None  Procedures  Critical Care performed: No  ____________________________________________   INITIAL IMPRESSION / ASSESSMENT AND PLAN / ED COURSE  Pertinent labs & imaging results that were available during my care of the patient were reviewed by me and considered in my medical decision making (see chart for details).   Patient presents for evaluation of left-sided thoracic to left lower back discomfort with associated left-sided CVA tenderness and some left flank discomfort.  Also reports a foamy urine with strong family history of renal disease.  She reports no cardiac or pulmonary symptoms except the pain does radiate occasionally up the length of her back to the point it hurts her neck at times, an EKG and chest x-ray appears reassuring.  No pulmonary or cardiac symptoms are overtly noted.  After reviewing patient's labs,  notable acute kidney injury which is new and given her foamy urine I am concerned about an acute intra-abdominal or primarily renal process possibly renal etiology though certainly prerenal or postrenal are considered and thus will proceed with CT scan without contrast.  Some limitation in CT imaging is no contrast was administered, patient is normotensive not complaining of any midline pain, felt to be unlikely to represent dissection I suspect likely a renal etiology for discomfort.  Will admit for further evaluation however  ----------------------------------------- 11:38 PM on 05/18/2018 -----------------------------------------  Will admit patient for further work-up regarding etiology of her discomfort, left flank pain and acute kidney injury.  Urinalysis pending.  Discussed case with Dr. Anne Hahn.  Patient agreeable and understand plan for admission.      ____________________________________________   FINAL CLINICAL IMPRESSION(S) / ED DIAGNOSES  Final diagnoses:  AKI (acute kidney injury) (  HCC)  Acute left flank pain        Note:  This document was prepared using Dragon voice recognition software and may include unintentional dictation errors       Sharyn Creamer, MD 05/18/18 2345

## 2018-05-18 NOTE — ED Triage Notes (Signed)
Pt reports for two weeks she has had pain in the left mid back area when she takes a deep breath that radiates up her neck. She states she has had PNA 11 times and this is what her symptoms usually are. She also c/o bloating and pain in her abdomen, last BM today, reports nausea, no vomiting. "Fluttering" in her chest when she lies down. No fever or cough. She saw her PCP yesterday and started her on Zpack and furosemide, she said she came to the ED today because she is hurting more.

## 2018-05-19 ENCOUNTER — Other Ambulatory Visit: Payer: Self-pay

## 2018-05-19 DIAGNOSIS — N170 Acute kidney failure with tubular necrosis: Secondary | ICD-10-CM | POA: Diagnosis not present

## 2018-05-19 DIAGNOSIS — N179 Acute kidney failure, unspecified: Secondary | ICD-10-CM | POA: Diagnosis present

## 2018-05-19 LAB — URINALYSIS, COMPLETE (UACMP) WITH MICROSCOPIC
Bacteria, UA: NONE SEEN
Bilirubin Urine: NEGATIVE
Glucose, UA: NEGATIVE mg/dL
Hgb urine dipstick: NEGATIVE
Ketones, ur: NEGATIVE mg/dL
Nitrite: NEGATIVE
Protein, ur: NEGATIVE mg/dL
Specific Gravity, Urine: 1.015 (ref 1.005–1.030)
pH: 5 (ref 5.0–8.0)

## 2018-05-19 LAB — TSH: TSH: 1.346 u[IU]/mL (ref 0.350–4.500)

## 2018-05-19 LAB — BASIC METABOLIC PANEL
ANION GAP: 7 (ref 5–15)
BUN: 38 mg/dL — ABNORMAL HIGH (ref 8–23)
CALCIUM: 9 mg/dL (ref 8.9–10.3)
CO2: 23 mmol/L (ref 22–32)
Chloride: 107 mmol/L (ref 98–111)
Creatinine, Ser: 1.7 mg/dL — ABNORMAL HIGH (ref 0.44–1.00)
GFR, EST AFRICAN AMERICAN: 36 mL/min — AB (ref >60)
GFR, EST NON AFRICAN AMERICAN: 31 mL/min — AB (ref >60)
Glucose, Bld: 132 mg/dL — ABNORMAL HIGH (ref 70–99)
Potassium: 4.1 mmol/L (ref 3.5–5.1)
Sodium: 137 mmol/L (ref 135–145)

## 2018-05-19 LAB — HEMOGLOBIN A1C
Hgb A1c MFr Bld: 5.8 % — ABNORMAL HIGH (ref 4.8–5.6)
Mean Plasma Glucose: 119.76 mg/dL

## 2018-05-19 MED ORDER — OXYCODONE-ACETAMINOPHEN 5-325 MG PO TABS
1.0000 | ORAL_TABLET | Freq: Four times a day (QID) | ORAL | Status: DC | PRN
  Administered 2018-05-19: 1 via ORAL
  Filled 2018-05-19: qty 1

## 2018-05-19 MED ORDER — TRIAMTERENE-HCTZ 37.5-25 MG PO TABS
1.0000 | ORAL_TABLET | Freq: Every day | ORAL | Status: DC
  Administered 2018-05-19: 1 via ORAL
  Filled 2018-05-19 (×2): qty 1

## 2018-05-19 MED ORDER — FUROSEMIDE 40 MG PO TABS
40.0000 mg | ORAL_TABLET | Freq: Every day | ORAL | Status: DC
  Administered 2018-05-19: 40 mg via ORAL
  Filled 2018-05-19: qty 1

## 2018-05-19 MED ORDER — ACETAMINOPHEN 325 MG PO TABS
650.0000 mg | ORAL_TABLET | Freq: Four times a day (QID) | ORAL | Status: DC | PRN
  Administered 2018-05-19: 09:00:00 650 mg via ORAL
  Filled 2018-05-19: qty 2

## 2018-05-19 MED ORDER — ONDANSETRON HCL 4 MG/2ML IJ SOLN: 4.0000 mg | Freq: Four times a day (QID) | INTRAMUSCULAR | Status: DC | PRN

## 2018-05-19 MED ORDER — DILTIAZEM HCL ER COATED BEADS 300 MG PO CP24
300.0000 mg | ORAL_CAPSULE | Freq: Every day | ORAL | Status: DC
  Administered 2018-05-19 – 2018-05-20 (×2): 300 mg via ORAL
  Filled 2018-05-19 (×2): qty 1

## 2018-05-19 MED ORDER — SODIUM CHLORIDE 0.9 % IV SOLN
INTRAVENOUS | Status: DC
  Administered 2018-05-19 – 2018-05-20 (×4): via INTRAVENOUS

## 2018-05-19 MED ORDER — ACETAMINOPHEN 650 MG RE SUPP: 650.0000 mg | Freq: Four times a day (QID) | RECTAL | Status: DC | PRN

## 2018-05-19 MED ORDER — ONDANSETRON HCL 4 MG PO TABS: 4.0000 mg | ORAL_TABLET | Freq: Four times a day (QID) | ORAL | Status: DC | PRN

## 2018-05-19 MED ORDER — DOCUSATE SODIUM 100 MG PO CAPS
100.0000 mg | ORAL_CAPSULE | Freq: Two times a day (BID) | ORAL | Status: DC
  Administered 2018-05-19 – 2018-05-20 (×3): 100 mg via ORAL
  Filled 2018-05-19 (×3): qty 1

## 2018-05-19 MED ORDER — AZITHROMYCIN 500 MG PO TABS
250.0000 mg | ORAL_TABLET | Freq: Every day | ORAL | Status: DC
  Administered 2018-05-19 – 2018-05-20 (×2): 250 mg via ORAL
  Filled 2018-05-19 (×2): qty 1

## 2018-05-19 MED ORDER — HEPARIN SODIUM (PORCINE) 5000 UNIT/ML IJ SOLN
5000.0000 [IU] | Freq: Three times a day (TID) | INTRAMUSCULAR | Status: DC
  Administered 2018-05-19 – 2018-05-20 (×4): 5000 [IU] via SUBCUTANEOUS
  Filled 2018-05-19 (×4): qty 1

## 2018-05-19 MED ORDER — PANTOPRAZOLE SODIUM 40 MG PO TBEC
40.0000 mg | DELAYED_RELEASE_TABLET | Freq: Every day | ORAL | Status: DC
  Administered 2018-05-19 – 2018-05-20 (×2): 40 mg via ORAL
  Filled 2018-05-19 (×2): qty 1

## 2018-05-19 MED ORDER — DIPHENHYDRAMINE-ZINC ACETATE 2-0.1 % EX CREA
1.0000 "application " | TOPICAL_CREAM | Freq: Three times a day (TID) | CUTANEOUS | Status: DC | PRN
  Filled 2018-05-19: qty 28

## 2018-05-19 NOTE — Progress Notes (Signed)
Admitted for left flank pain, acute renal failure.,  Patient CT renal stone protocol negative except constipation.  Started on IV fluids, laxatives.  Check another Medbery stat now to see if renal function improves.  Lab data, imaging reviewed 1.  Acute kidney injury likely due to diuretics, continue IV fluids, hold diuretics. 2.  Left flank pain likely due to constipation, continue laxatives. Essential hypertension, continue diltiazem, hold HCTZ, Lasix, lisinopril. Time spent 10 minutes

## 2018-05-19 NOTE — ED Notes (Signed)
Patient transported to room 120 by this EDT via stretcher.

## 2018-05-19 NOTE — ED Notes (Signed)
This RN and Raquel, RN attempted IV and unable to obtain.

## 2018-05-19 NOTE — H&P (Signed)
Katie Leon is an 62 y.o. female.   Chief Complaint: Neck pain HPI: The patient with past medical history of hypertension, gallstones status post cholecystectomy, pancreatitis and heart disease presents the emergency department complaining of mid back pain that radiates into her neck.  The pain is worse when she takes a deep breath but she denies shortness of breath at this time.  She has had this pain for 2 weeks.  Is also associated with abdominal pain.  The patient reports that she felt this way with pneumonia in the past.  The patient also reports some palpitations.  She was started on a Z-Pak yesterday.  She denies fever.  Chest x-ray and CT of her abdomen were unremarkable but laboratory evaluation revealed acute kidney injury which prompted the emergency department staff to call the hospitalist service for admission.  Past Medical History:  Diagnosis Date  . Arthritis   . Gallstones   . Heart disease   . Hypertension   . Pancreatitis     Past Surgical History:  Procedure Laterality Date  . ABDOMINAL HYSTERECTOMY    . Bilateral knee replacements  Bilateral   . CARDIAC CATHETERIZATION    . CHOLECYSTECTOMY N/A 04/19/2017   Procedure: LAPAROSCOPIC CHOLECYSTECTOMY;  Surgeon: Vickie Epley, MD;  Location: [facility] ORS;  Service: General;  Laterality: N/A;  . GASTROPLASTY VERTICAL BANDED  1999  . Hip replacement x2  Left   . JOINT REPLACEMENT    . Shouulder surgery  Bilateral    Rotator Cuff Repair  . TONSILLECTOMY     Adnoidectomy    Family History  Problem Relation Age of Onset  . Diabetes Father   . Kidney failure Father   . Diabetes Brother   . Kidney failure Brother   . Kidney failure Mother   . Diabetes Mellitus II Mother   . Diabetes Mellitus II Sister   . Kidney failure Sister   . Breast cancer Neg Hx    Social History:  reports that she has never smoked. She has never used smokeless tobacco. She reports that she does not drink alcohol or use drugs.  Allergies: No  Known Allergies  Medications Prior to Admission  Medication Sig Dispense Refill  . azithromycin (ZITHROMAX) 250 MG tablet Take by mouth daily.     Marland Kitchen diltiazem (CARDIZEM CD) 300 MG 24 hr capsule Take 300 mg by mouth daily.    Marland Kitchen esomeprazole (NEXIUM) 40 MG capsule Take 40 mg by mouth 2 (two) times daily before a meal.     . furosemide (LASIX) 40 MG tablet Take 40 mg by mouth daily.     Marland Kitchen lisinopril (PRINIVIL,ZESTRIL) 20 MG tablet Take 20 mg by mouth daily.     . meloxicam (MOBIC) 15 MG tablet Take 15 mg by mouth daily.    Marland Kitchen triamterene-hydrochlorothiazide (MAXZIDE-25) 37.5-25 MG tablet Take 1 tablet by mouth daily.    Marland Kitchen AFLURIA QUADRIVALENT injection     . diphenhydrAMINE-zinc acetate (BENADRYL EXTRA STRENGTH) cream Apply 1 application topically 3 (three) times daily as needed for itching. 28.4 g 0  . predniSONE (STERAPRED UNI-PAK 21 TAB) 10 MG (21) TBPK tablet Take by mouth daily. Dispense steroid taper pack as directed (Patient not taking: Reported on 05/19/2018) 21 tablet 0    Results for orders placed or performed during the hospital encounter of 05/18/18 (from the past 48 hour(s))  Basic metabolic panel     Status: Abnormal   Collection Time: 05/18/18  8:07 PM  Result Value Ref Range  Sodium 139 135 - 145 mmol/L   Potassium 3.7 3.5 - 5.1 mmol/L   Chloride 100 98 - 111 mmol/L   CO2 26 22 - 32 mmol/L   Glucose, Bld 98 70 - 99 mg/dL   BUN 39 (H) 8 - 23 mg/dL   Creatinine, Ser 2.27 (H) 0.44 - 1.00 mg/dL   Calcium 9.4 8.9 - 10.3 mg/dL   GFR calc non Af Amer 22 (L) >60 mL/min   GFR calc Af Amer 26 (L) >60 mL/min    Comment: (NOTE) The eGFR has been calculated using the CKD EPI equation. This calculation has not been validated in all clinical situations. eGFR's persistently <60 mL/min signify possible Chronic Kidney Disease.    Anion gap 13 5 - 15    Comment: Performed at [facility], [address] [place], [place] [address]  CBC     Status: Abnormal   Collection  Time: 05/18/18  8:07 PM  Result Value Ref Range   WBC 11.9 (H) 4.0 - 10.5 K/uL   RBC 4.40 3.87 - 5.11 MIL/uL   Hemoglobin 12.6 12.0 - 15.0 g/dL   HCT 39.3 36.0 - 46.0 %   MCV 89.3 80.0 - 100.0 fL   MCH 28.6 26.0 - 34.0 pg   MCHC 32.1 30.0 - 36.0 g/dL   RDW 14.9 11.5 - 15.5 %   Platelets 277 150 - 400 K/uL   nRBC 0.0 0.0 - 0.2 %    Comment: Performed at [facility], [address] [place], [place] [address]  Troponin I     Status: None   Collection Time: 05/18/18  8:07 PM  Result Value Ref Range   Troponin I <0.03 <0.03 ng/mL    Comment: Performed at [facility], [address] [place], [place] [address]  Lipase, blood     Status: Abnormal   Collection Time: 05/18/18  8:07 PM  Result Value Ref Range   Lipase 58 (H) 11 - 51 U/L    Comment: Performed at [facility], [address] [place], [place] [address]  CK     Status: Abnormal   Collection Time: 05/18/18  8:07 PM  Result Value Ref Range   Total CK 263 (H) 38 - 234 U/L    Comment: Performed at [facility], [address] [place], [place] [address]  TSH     Status: None   Collection Time: 05/18/18  8:07 PM  Result Value Ref Range   TSH 1.346 0.350 - 4.500 uIU/mL    Comment: Performed by a 3rd Generation assay with a functional sensitivity of <=0.01 uIU/mL. Performed at [facility], [address] [place], [place] [address]   Urinalysis, Complete w Microscopic     Status: Abnormal   Collection Time: 05/19/18  4:46 AM  Result Value Ref Range   Color, Urine YELLOW (A) YELLOW   APPearance CLEAR (A) CLEAR   Specific Gravity, Urine 1.015 1.005 - 1.030   pH 5.0 5.0 - 8.0   Glucose, UA NEGATIVE NEGATIVE mg/dL   Hgb urine dipstick NEGATIVE NEGATIVE   Bilirubin Urine NEGATIVE NEGATIVE   Ketones, ur NEGATIVE NEGATIVE mg/dL   Protein, ur NEGATIVE NEGATIVE mg/dL   Nitrite NEGATIVE NEGATIVE   Leukocytes, UA LARGE (A) NEGATIVE   RBC / HPF 6-10 0 - 5 RBC/hpf   WBC, UA 11-20  0 - 5 WBC/hpf   Bacteria, UA NONE SEEN NONE SEEN   Squamous Epithelial / LPF 0-5 0 - 5  Mucus PRESENT    Hyaline Casts, UA PRESENT     Comment: Performed at [facility], [address] [place], [place] [address]   Dg Chest 2 View  Result Date: 05/18/2018 CLINICAL DATA:  Back pain EXAM: CHEST - 2 VIEW COMPARISON:  None. FINDINGS: Upper normal heart size. Normal vascularity. Low lung volumes. Bibasilar atelectasis. No pneumothorax or pleural effusion. IMPRESSION: Bibasilar atelectasis. Electronically Signed   By: Marybelle Killings M.D.   On: 05/18/2018 20:26   Ct Renal Stone Study  Result Date: 05/18/2018 CLINICAL DATA:  Flank pain, stone disease suspected. EXAM: CT ABDOMEN AND PELVIS WITHOUT CONTRAST TECHNIQUE: Multidetector CT imaging of the abdomen and pelvis was performed following the standard protocol without IV contrast. COMPARISON:  None. FINDINGS: Lower chest: Stable cardiomegaly without pericardial effusion. Subsegmental atelectasis and/or scarring at each lung base. Hepatobiliary: Cholecystectomy. Homogeneous appearance of the liver without space-occupying mass or biliary dilatation. Pancreas: Unremarkable. No pancreatic ductal dilatation or surrounding inflammatory changes. Spleen: Normal Adrenals/Urinary Tract: No adrenal mass. No nephrolithiasis nor obstructive uropathy. The urinary bladder is unremarkable for the degree of distention. Stomach/Bowel: Status post bariatric surgery without abnormal dilatation of the excluded stomach. Moderate stool retention within the right colon. No small bowel dilatation. Normal appearance of the appendix. Vascular/Lymphatic: No significant vascular findings are present. No enlarged abdominal or pelvic lymph nodes. Reproductive: Status post hysterectomy. No adnexal masses. Other: Small periumbilical fat containing hernia. No free air nor free fluid. Musculoskeletal: Grade 1 anterolisthesis of L4 on L5 likely on the basis of degenerative disc and  facet arthropathy. Joint space narrowing, facet sclerosis and hypertrophy is identified from L4 through S1. No acute nor aggressive osseous lesions. IMPRESSION: 1. No nephrolithiasis nor obstructive uropathy. 2. Moderate stool retention within the right colon without bowel obstruction or inflammation. 3. Status post bariatric surgery without complicating features. 4. Grade 1 anterolisthesis of L4 on L5 with L4 through S1 facet arthropathy. Electronically Signed   By: Ashley Royalty M.D.   On: 05/18/2018 23:00    Review of Systems  Constitutional: Negative for chills and fever.  HENT: Negative for sore throat and tinnitus.   Eyes: Negative for blurred vision and redness.  Respiratory: Negative for cough and shortness of breath.   Cardiovascular: Positive for palpitations. Negative for chest pain, orthopnea and PND.  Gastrointestinal: Negative for abdominal pain, diarrhea, nausea and vomiting.  Genitourinary: Positive for flank pain. Negative for dysuria, frequency and urgency.  Musculoskeletal: Negative for joint pain and myalgias.  Skin: Negative for rash.       No lesions  Neurological: Negative for speech change, focal weakness and weakness.  Endo/Heme/Allergies: Does not bruise/bleed easily.       No temperature intolerance  Psychiatric/Behavioral: Negative for depression and suicidal ideas.    Blood pressure 126/75, pulse (!) 57, temperature (!) 97.5 F (36.4 C), temperature source Oral, resp. rate 14, height _0  (1.6 m), weight 124.8 kg, SpO2 100 %. Physical Exam  Vitals reviewed. Constitutional: She is oriented to person, place, and time. She appears well-developed and well-nourished. No distress.  HENT:  Head: Normocephalic and atraumatic.  Mouth/Throat: Oropharynx is clear and moist.  Eyes: Pupils are equal, round, and reactive to light. Conjunctivae and EOM are normal. No scleral icterus.  Neck: Normal range of motion. Neck supple. No JVD present. No tracheal deviation present.  No thyromegaly present.  Cardiovascular: Normal rate, regular rhythm and normal heart sounds. Exam reveals no gallop and no friction rub.  No murmur heard. Respiratory:  Effort normal and breath sounds normal.  GI: Soft. Bowel sounds are normal. She exhibits no distension. There is no tenderness.  Genitourinary:  Genitourinary Comments: Deferred  Musculoskeletal: Normal range of motion. She exhibits no edema.  Lymphadenopathy:    She has no cervical adenopathy.  Neurological: She is alert and oriented to person, place, and time. No cranial nerve deficit. She exhibits normal muscle tone.  Skin: Skin is warm and dry. No rash noted. No erythema.  Psychiatric: She has a normal mood and affect. Her behavior is normal. Judgment and thought content normal.     Assessment/Plan This is a 62 year old female admitted for acute kidney injury. 1.  Acute kidney injury: Hydrate with intravenous fluid.  Avoid nephrotoxic agents.  The patient has been eating well.  Etiology is unclear.  No history of FSGS but strong family history of diabetic nephropathy.  Consult nephrology if do not observe expected improvement with tomorrow's labs.  CK is elevated but nowhere near levels consistent with rhabdomyolysis related kidney failure. 2.  Hypertension: Controlled; continue triamterene and hydrochlorothiazide, and diltiazem.  Hold lisinopril due to acute kidney injury. 3.  Flank pain: Colicky; no associated nausea or vomiting.  Differential diagnosis includes constipation as seen on CT scan.  Kidney size unremarkable. 4.  DVT prophylaxis: 5.  GI prophylaxis: Pantoprazole per home regimen The patient is a full code.  Time spent on admission orders and patient care approximately 45 minutes  Harrie Foreman, MD 05/19/2018, 8:06 AM

## 2018-05-19 NOTE — Plan of Care (Signed)

## 2018-05-20 DIAGNOSIS — N170 Acute kidney failure with tubular necrosis: Secondary | ICD-10-CM | POA: Diagnosis not present

## 2018-05-20 LAB — BASIC METABOLIC PANEL
Anion gap: 12 (ref 5–15)
BUN: 30 mg/dL — AB (ref 8–23)
CALCIUM: 9.1 mg/dL (ref 8.9–10.3)
CHLORIDE: 106 mmol/L (ref 98–111)
CO2: 21 mmol/L — AB (ref 22–32)
CREATININE: 1.38 mg/dL — AB (ref 0.44–1.00)
GFR calc Af Amer: 47 mL/min — ABNORMAL LOW (ref >60)
GFR calc non Af Amer: 40 mL/min — ABNORMAL LOW (ref >60)
Glucose, Bld: 108 mg/dL — ABNORMAL HIGH (ref 70–99)
Potassium: 4 mmol/L (ref 3.5–5.1)
SODIUM: 139 mmol/L (ref 135–145)

## 2018-05-20 MED ORDER — CEPHALEXIN 500 MG PO CAPS: 500.0000 mg | ORAL_CAPSULE | Freq: Two times a day (BID) | ORAL | 0 refills | Status: AC

## 2018-05-20 MED ORDER — HYDRALAZINE HCL 25 MG PO TABS: 25.0000 mg | ORAL_TABLET | Freq: Four times a day (QID) | ORAL | 11 refills | Status: AC

## 2018-05-20 NOTE — Discharge Summary (Signed)
Katie Leon, is a 62 y.o. female  DOB September 11, 1955  MRN [id number].  Admission date:  05/18/2018  Admitting Physician  Arnaldo Natal, MD  Discharge Date:  05/20/2018   Primary MD  Garnet Koyanagi, FNP  Recommendations for primary care physician for things to follow:   Follow-up with PCP in 5 days regarding monitoring and have repeat BMPfor kidney function   Admission Diagnosis  AKI (acute kidney injury) (HCC) [N17.9] Acute left flank pain [R10.9]   Discharge Diagnosis  AKI (acute kidney injury) (HCC) [N17.9] Acute left flank pain [R10.9]    Active Problems:   AKI (acute kidney injury) (HCC)      Past Medical History:  Diagnosis Date  . Arthritis   . Gallstones   . Heart disease   . Hypertension   . Pancreatitis     Past Surgical History:  Procedure Laterality Date  . ABDOMINAL HYSTERECTOMY    . Bilateral knee replacements  Bilateral   . CARDIAC CATHETERIZATION    . CHOLECYSTECTOMY N/A 04/19/2017   Procedure: LAPAROSCOPIC CHOLECYSTECTOMY;  Surgeon: Ancil Linsey, MD;  Location: [facility] ORS;  Service: General;  Laterality: N/A;  . GASTROPLASTY VERTICAL BANDED  1999  . Hip replacement x2  Left   . JOINT REPLACEMENT    . Shouulder surgery  Bilateral    Rotator Cuff Repair  . TONSILLECTOMY     Adnoidectomy       History of present illness and  Hospital Course:     Kindly see H&P for history of present illness and admission details, please review complete Labs, Consult reports and Test reports for all details in brief  HPI  from the history and physical done on the day of admission 62 year old female patient with history of essential hypertension, arthritis, family history of ESRD on dialysis comes in because of mid back pain, shoulder pain, patient was given azithromycin recently by PCP because  of acute bronchitis.  Admitted for evaluation of acute renal failure on admission.  Patient has diffuse body pains when she came.   Hospital Course  #1 .acute renal failure likely due to diuretics, and ATN from hypotension, diuretic induced patient started on Lasix 40 mg daily by PCP recently in addition to her Maxide that she was taken for blood pressure.  Patient found to have acute renal failure with the creatinine of 2.27 with BUN 39 admission, received aggressive hydration, discontinue diuretics, creatinine today is 1.38, patient feels much better and wants to go home.  Discontinue Lasix, Maxide at discharge until seen by PCP, added hydralazine for her BP regimen, patient takes Cardizem at home. 2.  Essential hypertension, patient was hypotensive yesterday with 94/54 but BP is soft now, continue Cardizem, use hydralazine at home for blood pressure.  Patient needs PCP follow-up next week. 3.  Arthritis: Patient was taking Mobic advised her to stop Mobic in light of renal failure, patient can take Tylenol for joint pains and see PCP to see if she needs a different analgesic  #4 dysuria: UA is unremarkable but patient complains of pain while urinating, will give short course of keflex for 3 days.   Discharge Condition: Stable  Follow UP  Follow-up Information    Garnet Koyanagi, FNP. Schedule an appointment as soon as possible for a visit in 4 day(s).   Specialty:  Family Medicine Contact information: [address] [place] [address] [phone]             Discharge  Instructions  and  Discharge Medications      Allergies as of 05/20/2018   No Known Allergies     Medication List    STOP taking these medications   furosemide 40 MG tablet Commonly known as:  LASIX   lisinopril 20 MG tablet Commonly known as:  PRINIVIL,ZESTRIL   meloxicam 15 MG tablet Commonly known as:  MOBIC   predniSONE 10 MG (21) Tbpk tablet Commonly known as:  STERAPRED UNI-PAK 21  TAB   triamterene-hydrochlorothiazide 37.5-25 MG tablet Commonly known as:  MAXZIDE-25     TAKE these medications   AFLURIA QUADRIVALENT injection Generic drug:  influenza vac split quadrivalent   azithromycin 250 MG tablet Commonly known as:  ZITHROMAX Take by mouth daily.   cephALEXin 500 MG capsule Commonly known as:  KEFLEX Take 1 capsule (500 mg total) by mouth 2 (two) times daily for 3 days.   diltiazem 300 MG 24 hr capsule Commonly known as:  CARDIZEM CD Take 300 mg by mouth daily.   diphenhydrAMINE-zinc acetate cream Commonly known as:  BENADRYL Apply 1 application topically 3 (three) times daily as needed for itching.   esomeprazole 40 MG capsule Commonly known as:  NEXIUM Take 40 mg by mouth 2 (two) times daily before a meal.   hydrALAZINE 25 MG tablet Commonly known as:  APRESOLINE Take 1 tablet (25 mg total) by mouth 4 (four) times daily.         Diet and Activity recommendation: See Discharge Instructions above   Consults obtained -none   Major procedures and Radiology Reports - PLEASE review detailed and final reports for all details, in brief -      Dg Chest 2 View  Result Date: 05/18/2018 CLINICAL DATA:  Back pain EXAM: CHEST - 2 VIEW COMPARISON:  None. FINDINGS: Upper normal heart size. Normal vascularity. Low lung volumes. Bibasilar atelectasis. No pneumothorax or pleural effusion. IMPRESSION: Bibasilar atelectasis. Electronically Signed   By: Jolaine Click M.D.   On: 05/18/2018 20:26   Ct Renal Stone Study  Result Date: 05/18/2018 CLINICAL DATA:  Flank pain, stone disease suspected. EXAM: CT ABDOMEN AND PELVIS WITHOUT CONTRAST TECHNIQUE: Multidetector CT imaging of the abdomen and pelvis was performed following the standard protocol without IV contrast. COMPARISON:  None. FINDINGS: Lower chest: Stable cardiomegaly without pericardial effusion. Subsegmental atelectasis and/or scarring at each lung base. Hepatobiliary: Cholecystectomy.  Homogeneous appearance of the liver without space-occupying mass or biliary dilatation. Pancreas: Unremarkable. No pancreatic ductal dilatation or surrounding inflammatory changes. Spleen: Normal Adrenals/Urinary Tract: No adrenal mass. No nephrolithiasis nor obstructive uropathy. The urinary bladder is unremarkable for the degree of distention. Stomach/Bowel: Status post bariatric surgery without abnormal dilatation of the excluded stomach. Moderate stool retention within the right colon. No small bowel dilatation. Normal appearance of the appendix. Vascular/Lymphatic: No significant vascular findings are present. No enlarged abdominal or pelvic lymph nodes. Reproductive: Status post hysterectomy. No adnexal masses. Other: Small periumbilical fat containing hernia. No free air nor free fluid. Musculoskeletal: Grade 1 anterolisthesis of L4 on L5 likely on the basis of degenerative disc and facet arthropathy. Joint space narrowing, facet sclerosis and hypertrophy is identified from L4 through S1. No acute nor aggressive osseous lesions. IMPRESSION: 1. No nephrolithiasis nor obstructive uropathy. 2. Moderate stool retention within the right colon without bowel obstruction or inflammation. 3. Status post bariatric surgery without complicating features. 4. Grade 1 anterolisthesis of L4 on L5 with L4 through S1 facet arthropathy. Electronically Signed   By: Rene Kocher.D.  On: 05/18/2018 23:00    Micro Results     No results found for this or any previous visit (from the past 240 hour(s)).     Today   Subjective:   Katie Leon today has no headache,no chest abdominal pain,no new weakness tingling or numbness, feels much better wants to go home today.   Objective:   Blood pressure 118/75, pulse 74, temperature 98.2 F (36.8 C), temperature source Oral, resp. rate 16, height 5\' 3"  (1.6 m), weight 127.9 kg, SpO2 100 %.   Intake/Output Summary (Last 24 hours) at 05/20/2018 1141 Last data filed at  05/20/2018 0534 Gross per 24 hour  Intake 2608.33 ml  Output -  Net 2608.33 ml    Exam Awake Alert, Oriented x 3, No new F.N deficits, Normal affect [place].AT,PERRAL Supple Neck,No JVD, No cervical lymphadenopathy appriciated.  Symmetrical Chest wall movement, Good air movement bilaterally, CTAB RRR,No Gallops,Rubs or new Murmurs, No Parasternal Heave +ve B.Sounds, Abd Soft, Non tender, No organomegaly appriciated, No rebound -guarding or rigidity. No Cyanosis, Clubbing or edema, No new Rash or bruise  Data Review   CBC w Diff:  Lab Results  Component Value Date   WBC 11.9 (H) 05/18/2018   HGB 12.6 05/18/2018   HGB 12.9 02/16/2013   HCT 39.3 05/18/2018   HCT 38.9 02/16/2013   PLT 277 05/18/2018   PLT 205 02/16/2013   LYMPHOPCT 22 08/21/2017   LYMPHOPCT 23.7 02/16/2013   MONOPCT 10 08/21/2017   MONOPCT 8.7 02/16/2013   EOSPCT 5 08/21/2017   EOSPCT 3.6 02/16/2013   BASOPCT 1 08/21/2017   BASOPCT 1.0 02/16/2013    CMP:  Lab Results  Component Value Date   NA 139 05/20/2018   NA 142 10/01/2012   K 4.0 05/20/2018   K 3.7 10/01/2012   CL 106 05/20/2018   CL 107 10/01/2012   CO2 21 (L) 05/20/2018   CO2 30 10/01/2012   BUN 30 (H) 05/20/2018   BUN 20 (H) 10/01/2012   CREATININE 1.38 (H) 05/20/2018   CREATININE 0.90 10/01/2012   PROT 7.8 08/21/2017   PROT 8.1 02/16/2013   ALBUMIN 4.3 08/21/2017   ALBUMIN 3.6 02/16/2013   BILITOT 0.6 08/21/2017   BILITOT 0.3 02/16/2013   ALKPHOS 131 (H) 08/21/2017   ALKPHOS 138 (H) 02/16/2013   AST 26 08/21/2017   AST 22 02/16/2013   ALT 13 (L) 08/21/2017   ALT 24 02/16/2013  .   Total Time in preparing paper work, data evaluation and todays exam - 35 minutes  Katha Hamming M.D on 05/20/2018 at 11:41 AM    Note: This dictation was prepared with Dragon dictation along with smaller phrase technology. Any transcriptional errors that result from this process are unintentional.

## 2018-05-20 NOTE — Plan of Care (Signed)

## 2018-05-20 NOTE — Care Management CC44 (Signed)
Condition Code 44 Documentation Completed  Patient Details  Name: SHEVELLE SMITHER MRN: [id number] Date of Birth: Mar 21, 1956   Condition Code 44 given:  Yes Patient signature on Condition Code 44 notice:  Yes Documentation of 2 MD's agreement:  Yes Code 44 added to claim:  Yes    Virgel Manifold, RN 05/20/2018, 12:07 PM

## 2018-05-20 NOTE — Care Management Obs Status (Signed)
MEDICARE OBSERVATION STATUS NOTIFICATION   Patient Details  Name: CHANDRIA ROOKSTOOL MRN: [id number] Date of Birth: 04-Aug-1955   Medicare Observation Status Notification Given:  Yes    Laureen Frederic A Alanis Clift, RN 05/20/2018, 12:07 PM

## 2019-05-03 ENCOUNTER — Encounter: Payer: Self-pay | Admitting: Emergency Medicine

## 2019-05-03 ENCOUNTER — Emergency Department: Payer: Medicare Other

## 2019-05-03 ENCOUNTER — Other Ambulatory Visit: Payer: Self-pay

## 2019-05-03 ENCOUNTER — Emergency Department
Admission: EM | Admit: 2019-05-03 | Discharge: 2019-05-03 | Disposition: A | Discharge status: Home / Self Care | Payer: Medicare Other | Attending: Emergency Medicine | Admitting: Emergency Medicine

## 2019-05-03 DIAGNOSIS — R079 Chest pain, unspecified: Secondary | ICD-10-CM

## 2019-05-03 DIAGNOSIS — R0789 Other chest pain: Secondary | ICD-10-CM | POA: Diagnosis present

## 2019-05-03 DIAGNOSIS — R42 Dizziness and giddiness: Secondary | ICD-10-CM | POA: Diagnosis not present

## 2019-05-03 DIAGNOSIS — Z96642 Presence of left artificial hip joint: Secondary | ICD-10-CM | POA: Insufficient documentation

## 2019-05-03 DIAGNOSIS — I10 Essential (primary) hypertension: Secondary | ICD-10-CM | POA: Diagnosis not present

## 2019-05-03 LAB — CBC
HCT: 40.6 % (ref 36.0–46.0)
Hemoglobin: 13.3 g/dL (ref 12.0–15.0)
MCH: 26.9 pg (ref 26.0–34.0)
MCHC: 32.8 g/dL (ref 30.0–36.0)
MCV: 82.2 fL (ref 80.0–100.0)
Platelets: 243 10*3/uL (ref 150–400)
RBC: 4.94 MIL/uL (ref 3.87–5.11)
RDW: 15.3 % (ref 11.5–15.5)
WBC: 7.2 10*3/uL (ref 4.0–10.5)
nRBC: 0 % (ref 0.0–0.2)

## 2019-05-03 LAB — BASIC METABOLIC PANEL
Anion gap: 13 (ref 5–15)
BUN: 19 mg/dL (ref 8–23)
CO2: 21 mmol/L — ABNORMAL LOW (ref 22–32)
Calcium: 9.7 mg/dL (ref 8.9–10.3)
Chloride: 104 mmol/L (ref 98–111)
Creatinine, Ser: 0.74 mg/dL (ref 0.44–1.00)
GFR calc Af Amer: >60 mL/min (ref >60)
GFR calc non Af Amer: >60 mL/min (ref >60)
Glucose, Bld: 154 mg/dL — ABNORMAL HIGH (ref 70–99)
Potassium: 3.5 mmol/L (ref 3.5–5.1)
Sodium: 138 mmol/L (ref 135–145)

## 2019-05-03 LAB — TROPONIN I (HIGH SENSITIVITY): Troponin I (High Sensitivity): 7 ng/L (ref <18)

## 2019-05-03 NOTE — ED Notes (Signed)
FIRST NURSE note: pt c/o dizziness and CP since yesterday am. Speech clear, A&OX4

## 2019-05-03 NOTE — ED Triage Notes (Signed)
Pt in via POV, reports new onset generalized chest pressure, and dizziness since yesterday.  Denies any other symptoms.  NAD noted at this time.

## 2019-05-03 NOTE — ED Notes (Signed)
Pt is difficult stick; lab notified to collect blood work.

## 2019-05-03 NOTE — ED Provider Notes (Signed)
[facility] Emergency Department Provider Note  Time seen: 1:33 PM  I have reviewed the triage vital signs and the nursing notes.   HISTORY  Chief Complaint Chest Pain and Dizziness  HPI Katie Leon is a 63 y.o. female with a past medical history of gallstones, hypertension, presents to the emergency department for chest discomfort.  According to the patient over the past 2 days the patient has been experiencing intermittent chest discomfort described more as a pressure sensation as well as intermittent dizziness.  Patient states it feels like she needs to burp but she cannot.   Denies any fever cough or shortness of breath.  No leg pain or swelling.  Past Medical History:  Diagnosis Date  . Arthritis   . Gallstones   . Heart disease   . Hypertension   . Pancreatitis     Patient Active Problem List   Diagnosis Date Noted  . AKI (acute kidney injury) ([place]) 05/19/2018  . Biliary colic 23/76/2831  . Heart disease 04/09/2017  . Gallstone pancreatitis 03/29/2017  . Gallstones 03/29/2017  . HTN (hypertension) 03/29/2017    Past Surgical History:  Procedure Laterality Date  . ABDOMINAL HYSTERECTOMY    . Bilateral knee replacements  Bilateral   . CARDIAC CATHETERIZATION    . CHOLECYSTECTOMY N/A 04/19/2017   Procedure: LAPAROSCOPIC CHOLECYSTECTOMY;  Surgeon: Vickie Epley, MD;  Location: [facility] ORS;  Service: General;  Laterality: N/A;  . GASTROPLASTY VERTICAL BANDED  1999  . Hip replacement x2  Left   . JOINT REPLACEMENT    . Shouulder surgery  Bilateral    Rotator Cuff Repair  . TONSILLECTOMY     Adnoidectomy    Prior to Admission medications   Medication Sig Start Date End Date Taking? Authorizing Provider  AFLURIA QUADRIVALENT injection  04/28/17   [provider]  azithromycin (ZITHROMAX) 250 MG tablet Take by mouth daily.  05/17/18   [provider]  diltiazem (CARDIZEM CD) 300 MG 24 hr capsule Take 300 mg by mouth daily.  02/22/17   [provider]  diphenhydrAMINE-zinc acetate (BENADRYL EXTRA STRENGTH) cream Apply 1 application topically 3 (three) times daily as needed for itching. 08/21/17   Earleen Newport, MD  esomeprazole (NEXIUM) 40 MG capsule Take 40 mg by mouth 2 (two) times daily before a meal.  02/24/18   [provider]  hydrALAZINE (APRESOLINE) 25 MG tablet Take 1 tablet (25 mg total) by mouth 4 (four) times daily. 05/20/18 05/20/19  Epifanio Lesches, MD    No Known Allergies  Family History  Problem Relation Age of Onset  . Diabetes Father   . Kidney failure Father   . Diabetes Brother   . Kidney failure Brother   . Kidney failure Mother   . Diabetes Mellitus II Mother   . Diabetes Mellitus II Sister   . Kidney failure Sister   . Breast cancer Neg Hx     Social History Social History   Tobacco Use  . Smoking status: Never Smoker  . Smokeless tobacco: Never Used  Substance Use Topics  . Alcohol use: No  . Drug use: No    Review of Systems Constitutional: Negative for fever. Cardiovascular: Mild chest pressure Respiratory: Negative for shortness of breath.  Negative for cough Gastrointestinal: Negative for abdominal pain, vomiting  Musculoskeletal: Negative for leg pain or swelling Skin: Negative for skin complaints  Neurological: Negative for headache All other ROS negative  ____________________________________________   PHYSICAL EXAM:  VITAL SIGNS: ED  Triage Vitals  Enc Vitals Group     BP 05/03/19 1121 (!) 129/55     Pulse Rate 05/03/19 1121 86     Resp 05/03/19 1121 18     Temp 05/03/19 1121 97.7 F (36.5 C)     Temp Source 05/03/19 1121 Oral     SpO2 05/03/19 1121 97 %     Weight 05/03/19 1129 280 lb (127 kg)     Height 05/03/19 1129 5\' 3"  (1.6 m)     Head Circumference --      Peak Flow --      Pain Score 05/03/19 1129 10     Pain Loc --      Pain Edu? --      Excl. in GC? --    Constitutional: Alert and oriented. Well appearing  and in no distress. Eyes: Normal exam ENT      Head: Normocephalic and atraumatic.      Mouth/Throat: Mucous membranes are moist. Cardiovascular: Normal rate, regular rhythm.  Respiratory: Normal respiratory effort without tachypnea nor retractions. Breath sounds are clear  Gastrointestinal: Soft and nontender. No distention. Musculoskeletal: Nontender with normal range of motion in all extremities. No lower extremity tenderness. Neurologic:  Normal speech and language. No gross focal neurologic deficits  Skin:  Skin is warm, dry and intact.  Psychiatric: Mood and affect are normal.   ____________________________________________    EKG  EKG viewed and interpreted by myself shows normal sinus rhythm 87 bpm.  Narrow QRS, normal axis, normal intervals besides slight QTC prolongation.  Nonspecific ST changes.  ____________________________________________    RADIOLOGY  X-rays negative  ____________________________________________   INITIAL IMPRESSION / ASSESSMENT AND PLAN / ED COURSE  Pertinent labs & imaging results that were available during my care of the patient were reviewed by me and considered in my medical decision making (see chart for details).   Patient presents emergency department for chest pain/pressure sensation over the past 2 days.  Differential would include ACS, musculoskeletal pain, pneumonia, pneumothorax.  We will check labs including cardiac enzymes, chest x-ray and continue to closely monitor.  EKG is negative.  X-rays negative.  Labs including cardiac enzymes are largely within normal limits.  Patient will follow-up with Dr. 05/05/19 as an outpatient.  Patient agreeable to plan of care.  Katie Leon was evaluated in Emergency Department on 05/03/2019 for the symptoms described in the history of present illness. She was evaluated in the context of the global COVID-19 pandemic, which necessitated consideration that the patient might be at risk for infection  with the SARS-CoV-2 virus that causes COVID-19. Institutional protocols and algorithms that pertain to the evaluation of patients at risk for COVID-19 are in a state of rapid change based on information released by regulatory bodies including the CDC and federal and state organizations. These policies and algorithms were followed during the patient's care in the ED.  ____________________________________________   FINAL CLINICAL IMPRESSION(S) / ED DIAGNOSES  Chest pain   05/05/2019, MD 05/03/19 1501

## 2019-11-13 ENCOUNTER — Other Ambulatory Visit: Payer: Self-pay | Admitting: Family Medicine

## 2019-11-13 DIAGNOSIS — Z1231 Encounter for screening mammogram for malignant neoplasm of breast: Secondary | ICD-10-CM

## 2019-11-16 ENCOUNTER — Ambulatory Visit
Admission: RE | Admit: 2019-11-16 | Discharge: 2019-11-16 | Disposition: A | Discharge status: Home / Self Care | Payer: Medicare Other | Source: Ambulatory Visit | Attending: Family Medicine | Admitting: Family Medicine

## 2019-11-16 DIAGNOSIS — Z1231 Encounter for screening mammogram for malignant neoplasm of breast: Secondary | ICD-10-CM | POA: Insufficient documentation

## 2020-10-07 ENCOUNTER — Other Ambulatory Visit: Payer: Self-pay | Admitting: Family Medicine

## 2020-10-07 DIAGNOSIS — Z1231 Encounter for screening mammogram for malignant neoplasm of breast: Secondary | ICD-10-CM

## 2020-11-18 ENCOUNTER — Other Ambulatory Visit: Payer: Self-pay

## 2020-11-18 ENCOUNTER — Ambulatory Visit
Admission: RE | Admit: 2020-11-18 | Discharge: 2020-11-18 | Disposition: A | Discharge status: Home / Self Care | Payer: Medicare Other | Source: Ambulatory Visit | Attending: Family Medicine | Admitting: Family Medicine

## 2020-11-18 DIAGNOSIS — Z1231 Encounter for screening mammogram for malignant neoplasm of breast: Secondary | ICD-10-CM | POA: Diagnosis present

## 2020-11-19 ENCOUNTER — Ambulatory Visit: Payer: Medicare Other | Admitting: Physician Assistant

## 2020-11-19 VITALS — BP 171/93 | HR 76 | Temp 98.2°F | Resp 18 | Ht 62.0 in | Wt 282.0 lb

## 2020-11-19 DIAGNOSIS — I10 Essential (primary) hypertension: Secondary | ICD-10-CM | POA: Diagnosis not present

## 2020-11-19 DIAGNOSIS — R140 Abdominal distension (gaseous): Secondary | ICD-10-CM | POA: Insufficient documentation

## 2020-11-19 NOTE — Progress Notes (Signed)
Patient verified DOB Patient has taken medication today and has eaten today. Patient complains of left side pain intermittent for the past few months after eating.

## 2020-11-19 NOTE — Patient Instructions (Signed)
I have started you a referral to be seen by gastroenterology.  I do encourage you to work on lifestyle changes to see if that does help your bloating and discomfort.  I do encourage you to check your blood pressure at home, work on reducing the amount of sodium, and make sure that you keep your follow-up appointments with your primary care provider.  Please let us know if there is anything else we can do for you  Roney Jaffe, PA-C Physician Assistant [facility] [email or web address]    Abdominal Bloating When you have abdominal bloating, your abdomen may feel full, tight, or painful. It may also look bigger than normal or swollen (distended). Common causes of abdominal bloating include:  Swallowing air.  Constipation.  Problems digesting food.  Eating too much.  Irritable bowel syndrome. This is a condition that affects the large intestine.  Lactose intolerance. This is an inability to digest lactose, a natural sugar in dairy products.  Celiac disease. This is a condition that affects the ability to digest gluten, a protein found in some grains.  Gastroparesis. This is a condition that slows down the movement of food in the stomach and small intestine. It is more common in people with diabetes mellitus.  Gastroesophageal reflux disease (GERD). This is a digestive condition that makes stomach acid flow back into the esophagus.  Urinary retention. This means that the body is holding onto urine, and the bladder cannot be emptied all the way. Follow these instructions at home: Eating and drinking  Avoid eating too much.  Try not to swallow air while talking or eating.  Avoid eating while lying down.  Avoid these foods and drinks: ? Foods that cause gas, such as broccoli, cabbage, cauliflower, and baked beans. ? Carbonated drinks. ? Hard candy. ? Chewing gum. Medicines  Take over-the-counter and prescription  medicines only as told by your health care provider.  Take probiotic medicines. These medicines contain live bacteria or yeasts that can help digestion.  Take coated peppermint oil capsules. Activity  Try to exercise regularly. Exercise may help to relieve bloating that is caused by gas and relieve constipation. General instructions  Keep all follow-up visits as told by your health care provider. This is important. Contact a health care provider if:  You have nausea and vomiting.  You have diarrhea.  You have abdominal pain.  You have unusual weight loss or weight gain.  You have severe pain, and medicines do not help. Get help right away if:  You have severe chest pain.  You have trouble breathing.  You have shortness of breath.  You have trouble urinating.  You have darker urine than normal.  You have blood in your stools or have dark, tarry stools. Summary  Abdominal bloating means that the abdomen is swollen.  Common causes of abdominal bloating are swallowing air, constipation, and problems digesting food.  Avoid eating too much and avoid swallowing air.  Avoid foods that cause gas, carbonated drinks, hard candy, and chewing gum. This information is not intended to replace advice given to you by your health care provider. Make sure you discuss any questions you have with your health care provider. Document Revised: 11/07/2018 Document Reviewed: 08/21/2016 Elsevier Patient Education  2021 [employer].

## 2020-11-19 NOTE — Progress Notes (Signed)
New Patient Office Visit  Subjective:  Patient ID: Katie Leon, female    DOB: [birth year]  Age: 65 y.o. MRN: [id number]  CC:  Chief Complaint  Patient presents with  . Bloated    HPI Katie Leon reports that she has been having abdominal bloating after almost every meal for the past year.  Reports that she will sometimes have to have a bowel movement soon after eating, describes it as a liquid bowel movement, states that this does offer relief.  Denies abdominal pain, denies blood in stool.  Reports that she takes Nexium on a daily basis with relief of heartburn and acid reflux.  Does endorse that her diet consists mostly of breaded fried meats, gravies and [facility].  States that she does use a lot of salt and pepper on her foods.  States no previous colonoscopy, states she did have a Cologuard test that was negative.  Reports that she will check her blood pressure at home on occasion, states today's readings are "normal for her".  States that she is compliant to her blood pressure medications.  Denies hypertensive symptoms.    Past Medical History:  Diagnosis Date  . Arthritis   . Gallstones   . Heart disease   . Hypertension   . Pancreatitis     Past Surgical History:  Procedure Laterality Date  . ABDOMINAL HYSTERECTOMY    . Bilateral knee replacements  Bilateral   . CARDIAC CATHETERIZATION    . CHOLECYSTECTOMY N/A 04/19/2017   Procedure: LAPAROSCOPIC CHOLECYSTECTOMY;  Surgeon: Ancil Linsey, MD;  Location: [facility] ORS;  Service: General;  Laterality: N/A;  . GASTROPLASTY VERTICAL BANDED  1999  . Hip replacement x2  Left   . JOINT REPLACEMENT    . Shouulder surgery  Bilateral    Rotator Cuff Repair  . TONSILLECTOMY     Adnoidectomy    Family History  Problem Relation Age of Onset  . Diabetes Father   . Kidney failure Father   . Diabetes Brother   . Kidney failure Brother   . Kidney failure Mother   . Diabetes Mellitus II Mother   . Diabetes  Mellitus II Sister   . Kidney failure Sister   . Breast cancer Neg Hx     Social History   Socioeconomic History  . Marital status: Widowed    Spouse name: Not on file  . Number of children: Not on file  . Years of education: Not on file  . Highest education level: Not on file  Occupational History  . Not on file  Tobacco Use  . Smoking status: Never Smoker  . Smokeless tobacco: Never Used  Vaping Use  . Vaping Use: Never used  Substance and Sexual Activity  . Alcohol use: No  . Drug use: No  . Sexual activity: Not Currently  Other Topics Concern  . Not on file  Social History Narrative  . Not on file   Social Determinants of Health   Financial Resource Strain: Not on file  Food Insecurity: Not on file  Transportation Needs: Not on file  Physical Activity: Not on file  Stress: Not on file  Social Connections: Not on file  Intimate Partner Violence: Not on file    ROS Review of Systems  Constitutional: Negative for chills and fever.  HENT: Negative.   Eyes: Negative.   Respiratory: Negative for shortness of breath.   Cardiovascular: Negative for chest pain.  Gastrointestinal: Positive for diarrhea. Negative for abdominal  pain, blood in stool, nausea and vomiting.  Endocrine: Negative.   Genitourinary: Negative.   Musculoskeletal: Negative.   Skin: Negative.   Allergic/Immunologic: Negative.   Neurological: Negative.   Hematological: Negative.   Psychiatric/Behavioral: Negative.     Objective:   Today's Vitals: BP (!) 171/93 (BP Location: Left Arm, Patient Position: Sitting, Cuff Size: Large)   Pulse 76   Temp 98.2 F (36.8 C) (Oral)   Resp 18   Ht 5\' 2"  (1.575 m)   Wt 282 lb (127.9 kg)   SpO2 99%   BMI 51.58 kg/m   Physical Exam Vitals and nursing note reviewed.  Constitutional:      General: She is not in acute distress.    Appearance: Normal appearance. She is obese. She is not ill-appearing.  HENT:     Head: Normocephalic and atraumatic.      Right Ear: External ear normal.     Left Ear: External ear normal.     Nose: Nose normal.     Mouth/Throat:     Mouth: Mucous membranes are moist.     Pharynx: Oropharynx is clear.  Eyes:     Extraocular Movements: Extraocular movements intact.     Pupils: Pupils are equal, round, and reactive to light.  Cardiovascular:     Rate and Rhythm: Normal rate and regular rhythm.     Pulses: Normal pulses.     Heart sounds: Normal heart sounds.  Pulmonary:     Effort: Pulmonary effort is normal.     Breath sounds: Normal breath sounds.  Abdominal:     General: Abdomen is flat. There is no distension.     Palpations: Abdomen is soft.     Tenderness: There is no guarding.  Musculoskeletal:        General: Normal range of motion.     Cervical back: Normal range of motion and neck supple.  Skin:    General: Skin is warm and dry.  Neurological:     General: No focal deficit present.     Mental Status: She is alert and oriented to person, place, and time.  Psychiatric:        Mood and Affect: Mood normal.        Behavior: Behavior normal.        Thought Content: Thought content normal.        Judgment: Judgment normal.     Assessment & Plan:   Problem List Items Addressed This Visit      Cardiovascular and Mediastinum   HTN (hypertension)     Other   Abdominal bloating - Primary   Relevant Orders   Ambulatory referral to Gastroenterology      Outpatient Encounter Medications as of 11/19/2020  Medication Sig  . carvedilol (COREG) 6.25 MG tablet Take 6.25 mg by mouth 2 (two) times daily with a meal.  . diltiazem (CARDIZEM CD) 300 MG 24 hr capsule Take 300 mg by mouth daily.  11/21/2020 esomeprazole (NEXIUM) 40 MG capsule Take 40 mg by mouth 2 (two) times daily before a meal.   . furosemide (LASIX) 20 MG tablet Take 20 mg by mouth daily.  . hydrALAZINE (APRESOLINE) 25 MG tablet Take 1 tablet (25 mg total) by mouth 4 (four) times daily.  Marland Kitchen amLODipine (NORVASC) 10 MG tablet Take 10  mg by mouth daily.  . diphenhydrAMINE-zinc acetate (BENADRYL EXTRA STRENGTH) cream Apply 1 application topically 3 (three) times daily as needed for itching. (Patient not taking: Reported on 11/19/2020)  . [  DISCONTINUED] AFLURIA QUADRIVALENT injection  (Patient not taking: Reported on 11/19/2020)  . [DISCONTINUED] azithromycin (ZITHROMAX) 250 MG tablet Take by mouth daily.    No facility-administered encounter medications on file as of 11/19/2020.   1. Abdominal bloating Patient education given on lifestyle modifications, encouraged patient to do a trial of elimination diet, increase water intake.  Patient request referral to gastroenterology. - Ambulatory referral to Gastroenterology  2. Primary hypertension Patient encouraged to check blood pressure on a daily basis, keep a written log and have available for all office visits.  Patient states she has upcoming appointment with her primary care provider in June 2022, states her labs will be completed by her PCP at that time.  Red flags given for prompt reevaluation.   I have reviewed the patient's medical history (PMH, PSH, Social History, Family History, Medications, and allergies) , and have been updated if relevant. I spent 26 minutes reviewing chart and  face to face time with patient.     Follow-up: Return if symptoms worsen or fail to improve.   Kasandra Knudsen Mayers, PA-C

## 2021-09-19 ENCOUNTER — Other Ambulatory Visit: Payer: Self-pay | Admitting: Addiction Medicine

## 2021-09-19 ENCOUNTER — Other Ambulatory Visit: Payer: Self-pay | Admitting: Obstetrics & Gynecology

## 2021-09-19 ENCOUNTER — Other Ambulatory Visit: Payer: Self-pay | Admitting: Family Medicine

## 2021-09-19 DIAGNOSIS — Z1231 Encounter for screening mammogram for malignant neoplasm of breast: Secondary | ICD-10-CM

## 2021-09-22 ENCOUNTER — Other Ambulatory Visit: Payer: Self-pay

## 2021-09-22 DIAGNOSIS — Z1211 Encounter for screening for malignant neoplasm of colon: Secondary | ICD-10-CM

## 2021-09-22 MED ORDER — NA SULFATE-K SULFATE-MG SULF 17.5-3.13-1.6 GM/177ML PO SOLN: 1.0000 | Freq: Once | ORAL | 0 refills | Status: AC

## 2021-09-22 NOTE — Progress Notes (Signed)
Gastroenterology Pre-Procedure Review  Request Date: 10/24/2021 Requesting Physician: Dr. Allegra Lai   PATIENT REVIEW QUESTIONS: The patient responded to the following health history questions as indicated:    1. Are you having any GI issues? no 2. Do you have a personal history of Polyps? no 3. Do you have a family history of Colon Cancer or Polyps? yes (DAD HAD COLON CANCER) 4. Diabetes Mellitus? no 5. Joint replacements in the past 12 months?no 6. Major health problems in the past 3 months?no 7. Any artificial heart valves, MVP, or defibrillator?no    MEDICATIONS & ALLERGIES:    Patient reports the following regarding taking any anticoagulation/antiplatelet therapy:   Plavix, Coumadin, Eliquis, Xarelto, Lovenox, Pradaxa, Brilinta, or Effient? no Aspirin? no  Patient confirms/reports the following medications:  Current Outpatient Medications  Medication Sig Dispense Refill   amLODipine (NORVASC) 10 MG tablet Take 10 mg by mouth daily.     carvedilol (COREG) 6.25 MG tablet Take 6.25 mg by mouth 2 (two) times daily with a meal.     diltiazem (CARDIZEM CD) 300 MG 24 hr capsule Take 300 mg by mouth daily.     diphenhydrAMINE-zinc acetate (BENADRYL EXTRA STRENGTH) cream Apply 1 application topically 3 (three) times daily as needed for itching. 28.4 g 0   esomeprazole (NEXIUM) 40 MG capsule Take 40 mg by mouth 2 (two) times daily before a meal.      furosemide (LASIX) 20 MG tablet Take 20 mg by mouth daily.     hydrALAZINE (APRESOLINE) 25 MG tablet Take 1 tablet (25 mg total) by mouth 4 (four) times daily. 120 tablet 11   No current facility-administered medications for this visit.    Patient confirms/reports the following allergies:  No Known Allergies  No orders of the defined types were placed in this encounter.   AUTHORIZATION INFORMATION Primary Insurance: 1D#: Group #:  Secondary Insurance: 1D#: Group #:  SCHEDULE INFORMATION: Date:10/24/2021  Time: Location:[facility]

## 2021-10-24 ENCOUNTER — Encounter: Payer: Self-pay | Admitting: Gastroenterology

## 2021-10-24 ENCOUNTER — Ambulatory Visit: Payer: Medicare Other | Admitting: Anesthesiology

## 2021-10-24 ENCOUNTER — Ambulatory Visit
Admission: RE | Admit: 2021-10-24 | Discharge: 2021-10-24 | Disposition: A | Discharge status: Home / Self Care | Payer: Medicare Other | Attending: Gastroenterology | Admitting: Gastroenterology

## 2021-10-24 ENCOUNTER — Other Ambulatory Visit: Payer: Self-pay

## 2021-10-24 ENCOUNTER — Encounter
Admission: RE | Disposition: A | Discharge status: Home / Self Care | Payer: Self-pay | Source: Home / Self Care | Attending: Gastroenterology

## 2021-10-24 DIAGNOSIS — Z6841 Body Mass Index (BMI) 40.0 and over, adult: Secondary | ICD-10-CM | POA: Insufficient documentation

## 2021-10-24 DIAGNOSIS — Z1211 Encounter for screening for malignant neoplasm of colon: Secondary | ICD-10-CM | POA: Diagnosis present

## 2021-10-24 DIAGNOSIS — K219 Gastro-esophageal reflux disease without esophagitis: Secondary | ICD-10-CM | POA: Insufficient documentation

## 2021-10-24 DIAGNOSIS — I10 Essential (primary) hypertension: Secondary | ICD-10-CM | POA: Diagnosis not present

## 2021-10-24 HISTORY — PX: COLONOSCOPY WITH PROPOFOL: SHX5780

## 2021-10-24 SURGERY — COLONOSCOPY WITH PROPOFOL
Anesthesia: General

## 2021-10-24 MED ORDER — PROPOFOL 10 MG/ML IV BOLUS
INTRAVENOUS | Status: DC | PRN
  Administered 2021-10-24: 70 mg via INTRAVENOUS

## 2021-10-24 MED ORDER — PROPOFOL 500 MG/50ML IV EMUL
INTRAVENOUS | Status: DC | PRN
  Administered 2021-10-24: 125 ug/kg/min via INTRAVENOUS

## 2021-10-24 MED ORDER — LIDOCAINE HCL (CARDIAC) PF 100 MG/5ML IV SOSY
PREFILLED_SYRINGE | INTRAVENOUS | Status: DC | PRN
  Administered 2021-10-24 (×2): 50 mg via INTRAVENOUS

## 2021-10-24 MED ORDER — LIDOCAINE HCL (PF) 2 % IJ SOLN
INTRAMUSCULAR | Status: AC
  Filled 2021-10-24: qty 5

## 2021-10-24 MED ORDER — SODIUM CHLORIDE 0.9 % IV SOLN: INTRAVENOUS | Status: DC

## 2021-10-24 MED ORDER — PROPOFOL 500 MG/50ML IV EMUL
INTRAVENOUS | Status: AC
  Filled 2021-10-24: qty 50

## 2021-10-24 NOTE — Anesthesia Postprocedure Evaluation (Signed)
Anesthesia Post Note ? ?Patient: Katie Leon ? ?Procedure(s) Performed: COLONOSCOPY WITH PROPOFOL ? ?Patient location during evaluation: PACU ?Anesthesia Type: General ?Level of consciousness: awake and alert, oriented and patient cooperative ?Pain management: pain level controlled ?Vital Signs Assessment: post-procedure vital signs reviewed and stable ?Respiratory status: spontaneous breathing, nonlabored ventilation and respiratory function stable ?Cardiovascular status: blood pressure returned to baseline and stable ?Postop Assessment: adequate PO intake ?Anesthetic complications: no ? ? ?No notable events documented. ? ? ?Last Vitals:  ?Vitals:  ? 10/24/21 1027 10/24/21 1037  ?BP: (!) 102/59 (!) 106/56  ?Pulse: 64   ?Resp: 16   ?Temp: 36.4 ?C   ?SpO2: 98%   ?  ?Last Pain:  ?Vitals:  ? 10/24/21 1047  ?TempSrc:   ?PainSc: 0-No pain  ? ? ?  ?  ?  ?  ?  ?  ? ?Reed Breech ? ? ? ? ?

## 2021-10-24 NOTE — Op Note (Signed)
[facility] ?Gastroenterology ?Patient Name: Katie Leon ?Procedure Date: 10/24/2021 9:56 AM ?MRN: [id number] ?Account #: [id number] ?Date of Birth: 10-18-1955 ?Admit Type: Outpatient ?Age: 66 ?Room: [facility] ENDO ROOM 2 ?Gender: Female ?Note Status: Finalized ?Instrument Name: Colonscope [id number] ?Procedure:             Colonoscopy ?Indications:           Screening for colorectal malignant neoplasm, Last  ?                       colonoscopy: August 2014, Last colonoscopy 10 years ago ?Providers:             Lin Landsman MD, MD ?Medicines:             General Anesthesia ?Complications:         No immediate complications. Estimated blood loss: None. ?Procedure:             Pre-Anesthesia Assessment: ?                       - Prior to the procedure, a History and Physical was  ?                       performed, and patient medications and allergies were  ?                       reviewed. The patient is competent. The risks and  ?                       benefits of the procedure and the sedation options and  ?                       risks were discussed with the patient. All questions  ?                       were answered and informed consent was obtained.  ?                       Patient identification and proposed procedure were  ?                       verified by the physician, the nurse, the  ?                       anesthesiologist, the anesthetist and the technician  ?                       in the pre-procedure area in the procedure room in the  ?                       endoscopy suite. Mental Status Examination: alert and  ?                       oriented. Airway Examination: normal oropharyngeal  ?                       airway and neck mobility. Respiratory Examination:  ?  clear to auscultation. CV Examination: normal.  ?                       Prophylactic Antibiotics: The patient does not require  ?                       prophylactic antibiotics. Prior Anticoagulants:  The  ?                       patient has taken no previous anticoagulant or  ?                       antiplatelet agents. ASA Grade Assessment: III - A  ?                       patient with severe systemic disease. After reviewing  ?                       the risks and benefits, the patient was deemed in  ?                       satisfactory condition to undergo the procedure. The  ?                       anesthesia plan was to use general anesthesia.  ?                       Immediately prior to administration of medications,  ?                       the patient was re-assessed for adequacy to receive  ?                       sedatives. The heart rate, respiratory rate, oxygen  ?                       saturations, blood pressure, adequacy of pulmonary  ?                       ventilation, and response to care were monitored  ?                       throughout the procedure. The physical status of the  ?                       patient was re-assessed after the procedure. ?                       After obtaining informed consent, the colonoscope was  ?                       passed under direct vision. Throughout the procedure,  ?                       the patient's blood pressure, pulse, and oxygen  ?                       saturations were monitored continuously. The  ?  Colonoscope was introduced through the anus and  ?                       advanced to the the cecum, identified by appendiceal  ?                       orifice and ileocecal valve. The colonoscopy was  ?                       performed without difficulty. The patient tolerated  ?                       the procedure well. The quality of the bowel  ?                       preparation was evaluated using the BBPS [facility] Bowel  ?                       Preparation Scale) with scores of: Right Colon = 3,  ?                       Transverse Colon = 3 and Left Colon = 3 (entire mucosa  ?                       seen well with no residual  staining, small fragments  ?                       of stool or opaque liquid). The total BBPS score  ?                       equals 9. ?Findings: ?     The perianal and digital rectal examinations were normal. Pertinent  ?     negatives include normal sphincter tone and no palpable rectal lesions. ?     The entire examined colon appeared normal. ?     The retroflexed view of the distal rectum and anal verge was normal and  ?     showed no anal or rectal abnormalities. ?Impression:            - The entire examined colon is normal. ?                       - The distal rectum and anal verge are normal on  ?                       retroflexion view. ?                       - No specimens collected. ?Recommendation:        - Discharge patient to home (with escort). ?                       - Resume previous diet today. ?                       - Continue present medications. ?                       - Repeat colonoscopy in 10 years  for screening  ?                       purposes. ?Procedure Code(s):     --- Professional --- ?                       [id number], Colorectal cancer screening; colonoscopy on  ?                       individual not meeting criteria for high risk ?Diagnosis Code(s):     --- Professional --- ?                       Z12.11, Encounter for screening for malignant neoplasm  ?                       of colon ?CPT copyright 2019 American Medical Association. All rights reserved. ?The codes documented in this report are preliminary and upon coder review may  ?be revised to meet current compliance requirements. ?Dr. Ulyess Mort ?Bentleigh Stankus Raeanne Gathers MD, MD ?10/24/2021 10:24:54 AM ?This report has been signed electronically. ?Number of Addenda: 0 ?Note Initiated On: 10/24/2021 9:56 AM ?Scope Withdrawal Time: 0 hours 8 minutes 6 seconds  ?Total Procedure Duration: 0 hours 10 minutes 33 seconds  ?Estimated Blood Loss:  Estimated blood loss: none. ?     [facility] ?

## 2021-10-24 NOTE — Transfer of Care (Signed)
Immediate Anesthesia Transfer of Care Note ? ?Patient: Katie Leon ? ?Procedure(s) Performed: COLONOSCOPY WITH PROPOFOL ? ?Patient Location: PACU ? ?Anesthesia Type:General ? ?Level of Consciousness: awake, alert  and oriented ? ?Airway & Oxygen Therapy: Patient Spontanous Breathing ? ?Post-op Assessment: Report given to RN and Post -op Vital signs reviewed and stable ? ?Post vital signs: Reviewed and stable ? ?Last Vitals:  ?Vitals Value Taken Time  ?BP    ?Temp 36.4 ?C 10/24/21 1027  ?Pulse    ?Resp    ?SpO2    ? ? ?Last Pain:  ?Vitals:  ? 10/24/21 1027  ?TempSrc: Temporal  ?PainSc:   ?   ? ?  ? ?Complications: No notable events documented. ?

## 2021-10-24 NOTE — H&P (Signed)
?Katie Darby, MD ?[address]  ?[address]  ?[place], [place] [address]  ?Main: [phone]  ?Fax: [phone] ?Pager: [phone] ? ?Primary Care Physician:  Katie Rob, FNP ?Primary Gastroenterologist:  Dr. Cephas Leon ? ?Pre-Procedure History & Physical: ?HPI:  Katie Leon is a 66 y.o. female is here for an colonoscopy. ?  ?Past Medical History:  ?Diagnosis Date  ? Arthritis   ? Gallstones   ? Heart disease   ? Hypertension   ? Pancreatitis   ? ? ?Past Surgical History:  ?Procedure Laterality Date  ? ABDOMINAL HYSTERECTOMY    ? Bilateral knee replacements  Bilateral   ? CARDIAC CATHETERIZATION    ? CHOLECYSTECTOMY N/A 04/19/2017  ? Procedure: LAPAROSCOPIC CHOLECYSTECTOMY;  Surgeon: Katie Epley, MD;  Location: [facility] ORS;  Service: General;  Laterality: N/A;  ? GASTROPLASTY VERTICAL BANDED  1999  ? Hip replacement x2  Left   ? JOINT REPLACEMENT    ? Shouulder surgery  Bilateral   ? Rotator Cuff Repair  ? TONSILLECTOMY    ? Adnoidectomy  ? ? ?Prior to Admission medications   ?Medication Sig Start Date End Date Taking? Authorizing Provider  ?amLODipine (NORVASC) 10 MG tablet Take 10 mg by mouth daily. 10/11/18 10/24/21 Yes [provider]  ?carvedilol (COREG) 6.25 MG tablet Take 6.25 mg by mouth 2 (two) times daily with a meal. 10/11/18 10/24/21 Yes [provider]  ?diltiazem (CARDIZEM CD) 300 MG 24 hr capsule Take 300 mg by mouth daily. 02/22/17  Yes [provider]  ?esomeprazole (NEXIUM) 40 MG capsule Take 40 mg by mouth 2 (two) times daily before a meal.  02/24/18  Yes [provider]  ?furosemide (LASIX) 20 MG tablet Take 20 mg by mouth daily. 09/21/18  Yes [provider]  ?hydrALAZINE (APRESOLINE) 25 MG tablet Take 1 tablet (25 mg total) by mouth 4 (four) times daily. 05/20/18 10/24/21 Yes Katie Lesches, MD  ?diphenhydrAMINE-zinc acetate (BENADRYL EXTRA STRENGTH) cream Apply 1 application topically 3 (three) times daily as needed for  itching. 08/21/17   Katie Newport, MD  ? ? ?Allergies as of 09/22/2021  ? (No Known Allergies)  ? ? ?Family History  ?Problem Relation Age of Onset  ? Diabetes Father   ? Kidney failure Father   ? Diabetes Brother   ? Kidney failure Brother   ? Kidney failure Mother   ? Diabetes Mellitus II Mother   ? Diabetes Mellitus II Sister   ? Kidney failure Sister   ? Breast cancer Neg Hx   ? ? ?Social History  ? ?Socioeconomic History  ? Marital status: Widowed  ?  Spouse name: Not on file  ? Number of children: Not on file  ? Years of education: Not on file  ? Highest education level: Not on file  ?Occupational History  ? Not on file  ?Tobacco Use  ? Smoking status: Never  ? Smokeless tobacco: Never  ?Vaping Use  ? Vaping Use: Never used  ?Substance and Sexual Activity  ? Alcohol use: No  ? Drug use: No  ? Sexual activity: Not Currently  ?Other Topics Concern  ? Not on file  ?Social History Narrative  ? Not on file  ? ?Social Determinants of Health  ? ?Financial Resource Strain: Not on file  ?Food Insecurity: Not on file  ?Transportation Needs: Not on file  ?Physical Activity: Not on file  ?Stress: Not on file  ?Social Connections: Not on file  ?Intimate Partner Violence:  Not on file  ? ? ?Review of Systems: ?See HPI, otherwise negative ROS ? ?Physical Exam: ?BP (!) 157/89   Pulse 72   Temp 97.8 ?F (36.6 ?C) (Temporal)   Resp 18   Ht 5\' 3"  (1.6 m)   Wt 117.9 kg   SpO2 100%   BMI 46.06 kg/m?  ?General:   Alert,  pleasant and cooperative in NAD ?Head:  Normocephalic and atraumatic. ?Neck:  Supple; no masses or thyromegaly. ?Lungs:  Clear throughout to auscultation.    ?Heart:  Regular rate and rhythm. ?Abdomen:  Soft, nontender and nondistended. Normal bowel sounds, without guarding, and without rebound.   ?Neurologic:  Alert and  oriented x4;  grossly normal neurologically. ? ?Impression/Plan: ?Katie Leon is here for an colonoscopy to be performed for colon cancer screening ? ?Risks, benefits, limitations,  and alternatives regarding  colonoscopy have been reviewed with the patient.  Questions have been answered.  All parties agreeable. ? ? ?Katie Sear, MD  10/24/2021, 9:29 AM ?

## 2021-10-24 NOTE — Anesthesia Preprocedure Evaluation (Addendum)
Anesthesia Evaluation  ?Patient identified by MRN, date of birth, ID band ?Patient awake ? ? ? ?Reviewed: ?Allergy & Precautions, NPO status , Patient's Chart, lab work & pertinent test results ? ?History of Anesthesia Complications ?Negative for: history of anesthetic complications ? ?Airway ?Mallampati: III ? ? ?Neck ROM: Full ? ? ? Dental ? ? ?Missing molars x3:   ?Pulmonary ?neg pulmonary ROS,  ?  ?Pulmonary exam normal ?breath sounds clear to auscultation ? ? ? ? ? ? Cardiovascular ?hypertension, Normal cardiovascular exam ?Rhythm:Regular Rate:Normal ? ? ?  ?Neuro/Psych ?negative neurological ROS ?   ? GI/Hepatic ?GERD  Medicated and Controlled,  ?Endo/Other  ?Class 3 obesity ? Renal/GU ?negative Renal ROS  ? ?  ?Musculoskeletal ? ?(+) Arthritis ,  ? Abdominal ?  ?Peds ? Hematology ?negative hematology ROS ?(+)   ?Anesthesia Other Findings ? ? Reproductive/Obstetrics ? ?  ? ? ? ? ? ? ? ? ? ? ? ? ? ?  ?  ? ? ? ? ? ? ? ?Anesthesia Physical ?Anesthesia Plan ? ?ASA: 3 ? ?Anesthesia Plan: General  ? ?Post-op Pain Management:   ? ?Induction: Intravenous ? ?PONV Risk Score and Plan: 3 and Propofol infusion, TIVA and Treatment may vary due to age or medical condition ? ?Airway Management Planned: Natural Airway ? ?Additional Equipment:  ? ?Intra-op Plan:  ? ?Post-operative Plan:  ? ?Informed Consent: I have reviewed the patients History and Physical, chart, labs and discussed the procedure including the risks, benefits and alternatives for the proposed anesthesia with the patient or authorized representative who has indicated his/her understanding and acceptance.  ? ? ? ? ? ?Plan Discussed with: CRNA ? ?Anesthesia Plan Comments: (LMA/GETA backup discussed.  Patient consented for risks of anesthesia including but not limited to:  ?- adverse reactions to medications ?- damage to eyes, teeth, lips or other oral mucosa ?- nerve damage due to positioning  ?- sore throat or hoarseness ?-  damage to heart, brain, nerves, lungs, other parts of body or loss of life ? ?Informed patient about role of CRNA in peri- and intra-operative care.  Patient voiced understanding.)  ? ? ? ? ? ? ?Anesthesia Quick Evaluation ? ?

## 2021-10-27 ENCOUNTER — Encounter: Payer: Self-pay | Admitting: Gastroenterology

## 2021-11-12 IMAGING — MG MM DIGITAL SCREENING BILAT W/ TOMO AND CAD
6 of 12 series · 6 of 36 positions shown · non-contrast
Comparison: Previous exam(s).

CLINICAL DATA: Screening.

EXAM:
DIGITAL SCREENING BILATERAL MAMMOGRAM WITH TOMOSYNTHESIS AND CAD
TECHNIQUE: Bilateral screening digital craniocaudal and mediolateral oblique
mammograms were obtained. Bilateral screening digital breast
tomosynthesis was performed. The images were evaluated with
computer-aided detection.

[R CC synth-2D]
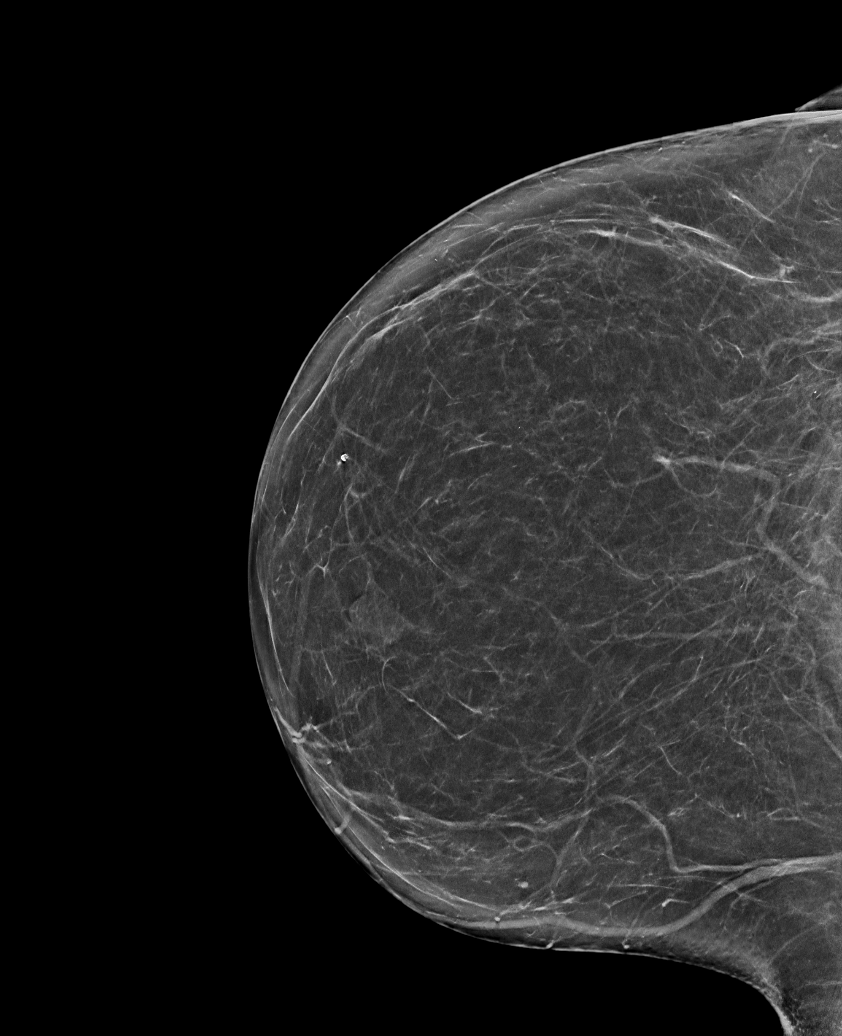

[R MLO synth-2D (1 of 2)]
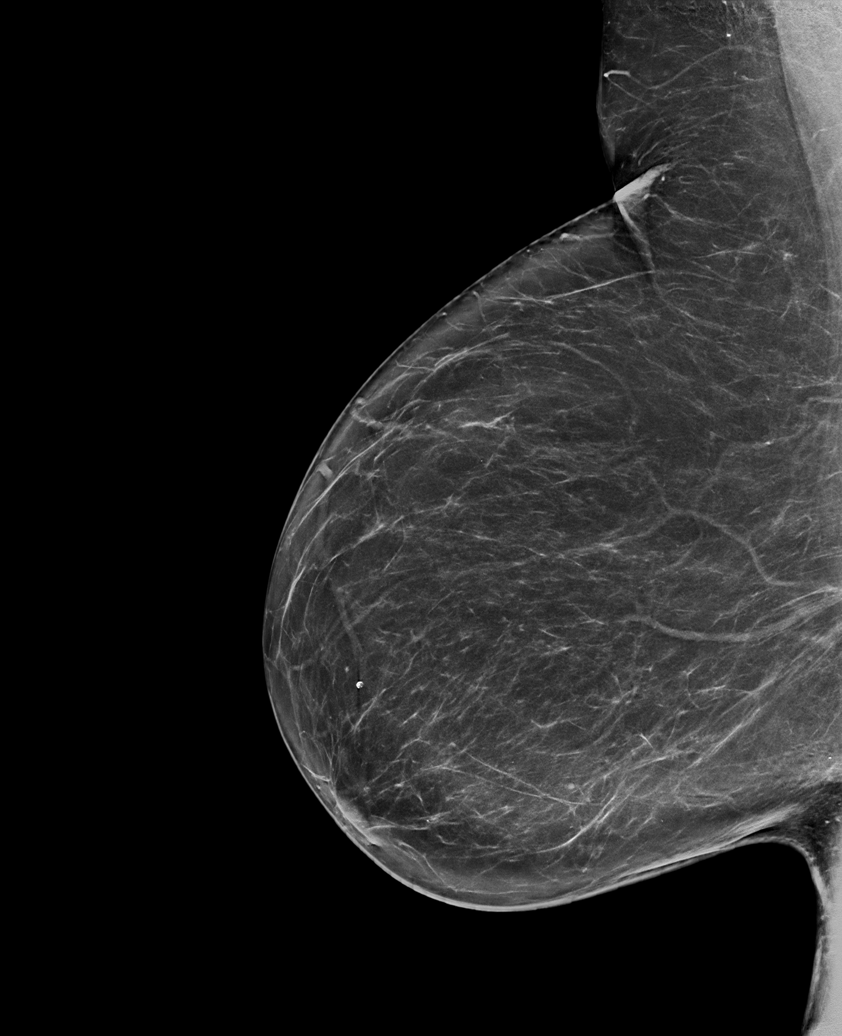

[L MLO synth-2D (1 of 2)]
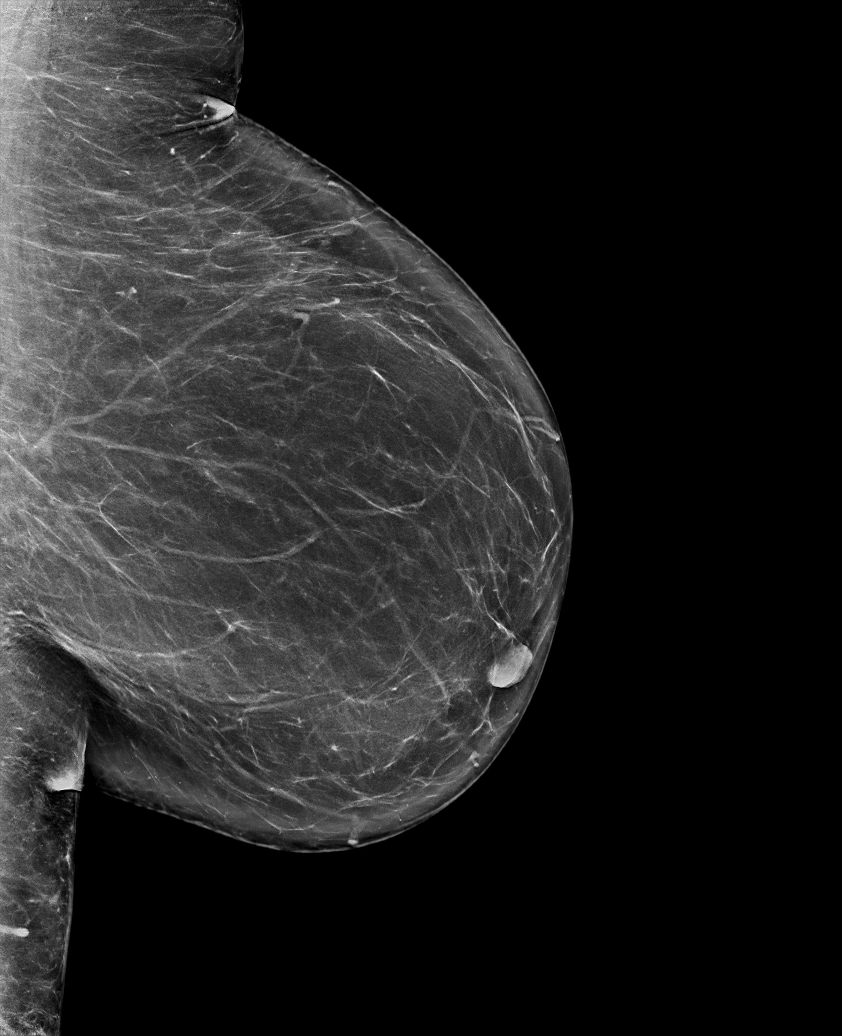

[R MLO synth-2D (2 of 2)]
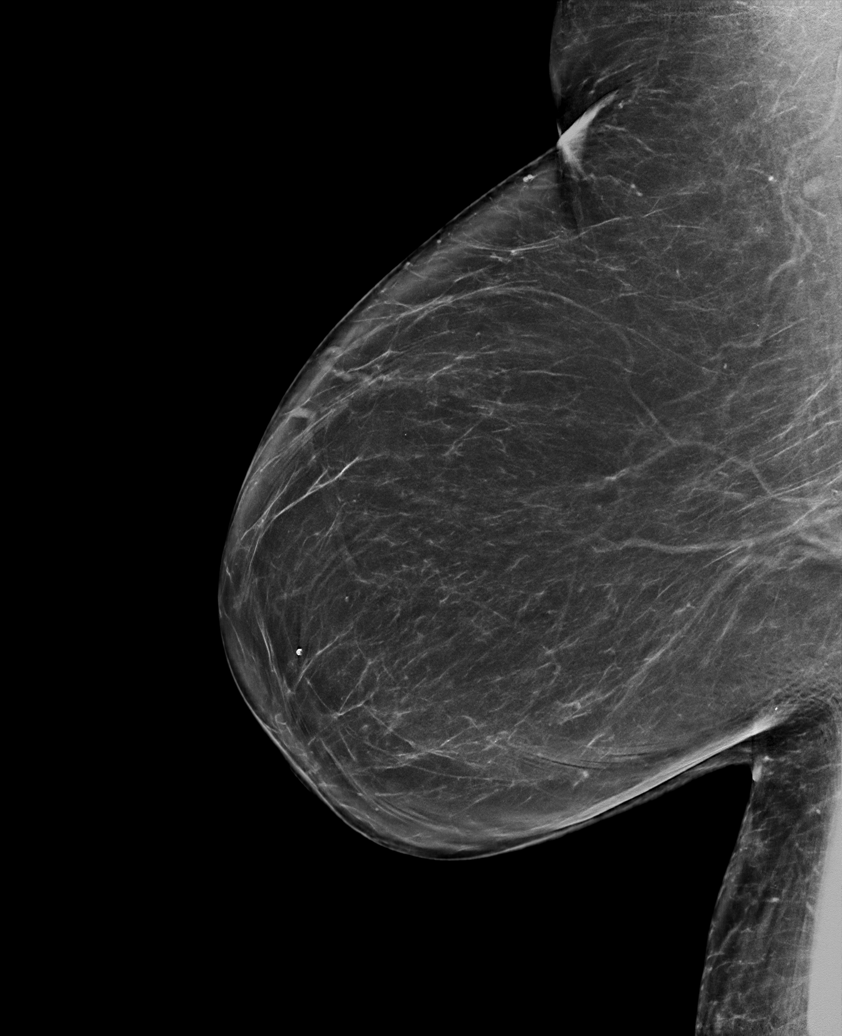

[L MLO synth-2D (2 of 2)]
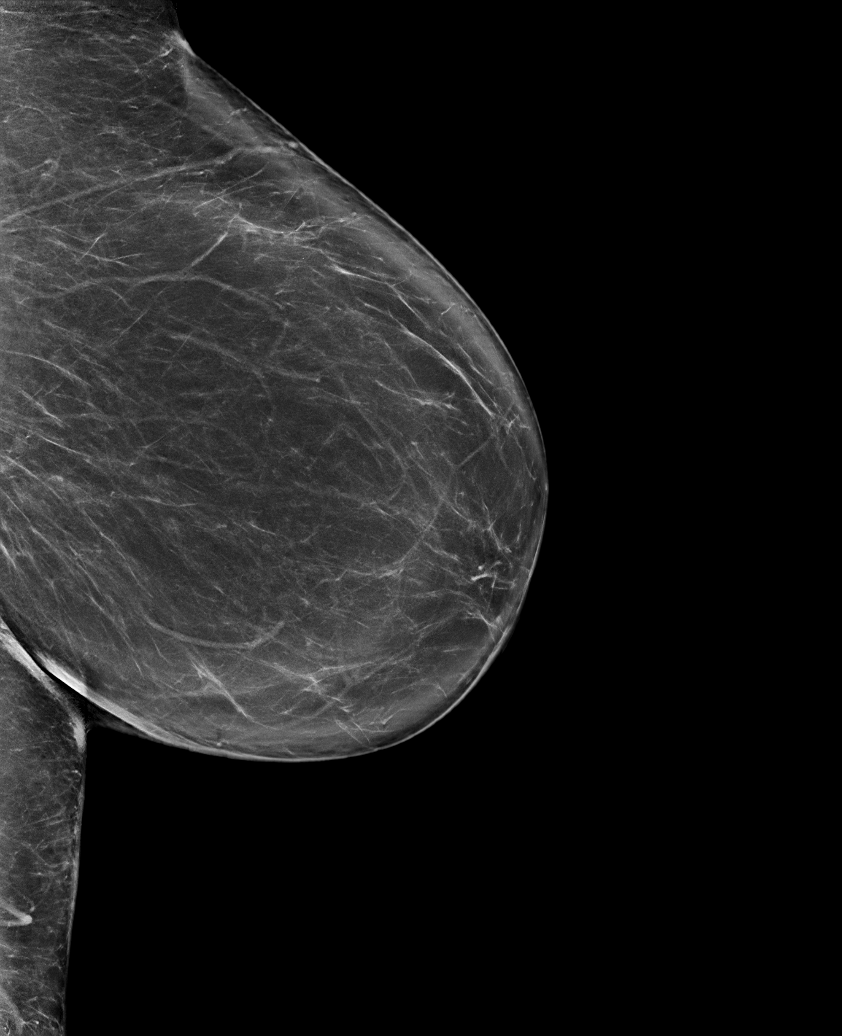

[L CC synth-2D]
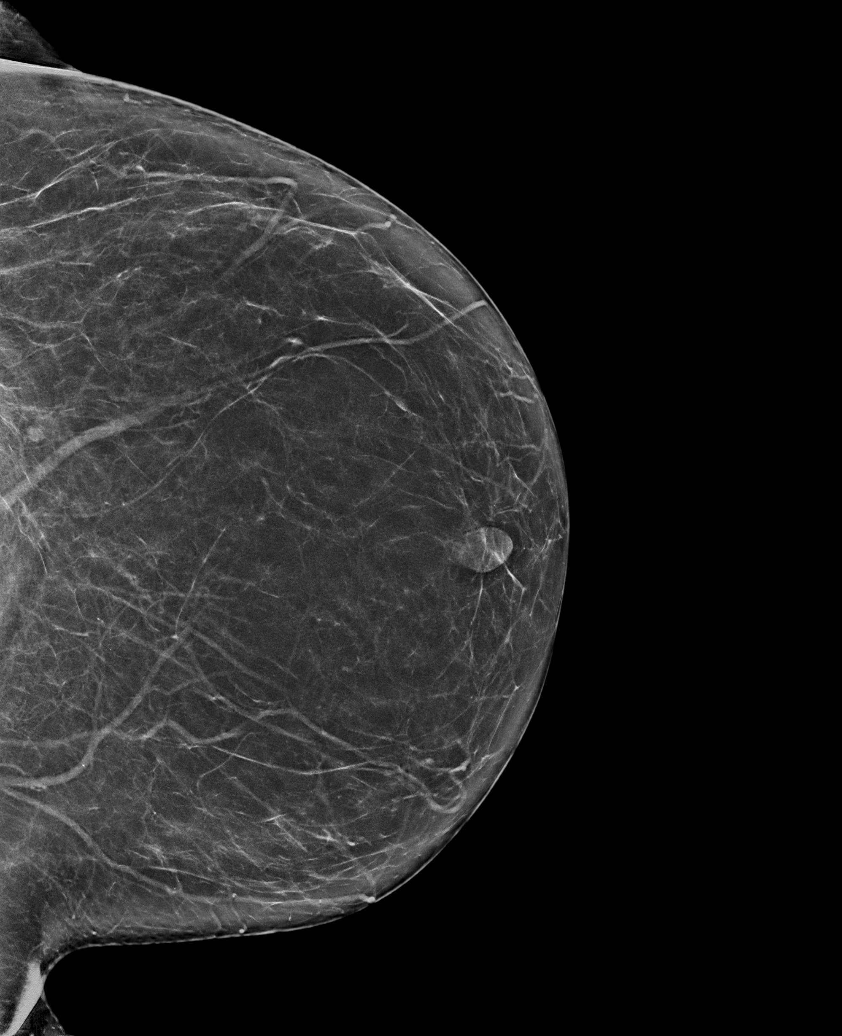

[6 of 36 positions shown; findings below may reference images not displayed]

ACR Breast Density Category b: There are scattered areas of
fibroglandular density.
FINDINGS: There are no findings suspicious for malignancy. The images were
evaluated with computer-aided detection.
IMPRESSION: No mammographic evidence of malignancy. A result letter of this
screening mammogram will be mailed directly to the patient.

RECOMMENDATION:
Screening mammogram in one year. (Code:[id number])

BI-RADS CATEGORY  1: Negative.

## 2021-11-19 ENCOUNTER — Ambulatory Visit
Admission: RE | Admit: 2021-11-19 | Discharge: 2021-11-19 | Disposition: A | Discharge status: Home / Self Care | Payer: Medicare Other | Source: Ambulatory Visit | Attending: Family Medicine | Admitting: Family Medicine

## 2021-11-19 DIAGNOSIS — Z1231 Encounter for screening mammogram for malignant neoplasm of breast: Secondary | ICD-10-CM | POA: Insufficient documentation

## 2023-07-01 ENCOUNTER — Other Ambulatory Visit: Payer: Self-pay

## 2023-07-01 ENCOUNTER — Emergency Department: Payer: 59

## 2023-07-01 ENCOUNTER — Emergency Department
Admission: EM | Admit: 2023-07-01 | Discharge: 2023-07-01 | Disposition: A | Discharge status: Home / Self Care | Payer: 59 | Attending: Emergency Medicine | Admitting: Emergency Medicine

## 2023-07-01 DIAGNOSIS — R0602 Shortness of breath: Secondary | ICD-10-CM | POA: Insufficient documentation

## 2023-07-01 DIAGNOSIS — I509 Heart failure, unspecified: Secondary | ICD-10-CM | POA: Insufficient documentation

## 2023-07-01 DIAGNOSIS — R079 Chest pain, unspecified: Secondary | ICD-10-CM | POA: Diagnosis present

## 2023-07-01 DIAGNOSIS — R2243 Localized swelling, mass and lump, lower limb, bilateral: Secondary | ICD-10-CM | POA: Diagnosis not present

## 2023-07-01 LAB — URINALYSIS, ROUTINE W REFLEX MICROSCOPIC
Bacteria, UA: NONE SEEN
Bilirubin Urine: NEGATIVE
Glucose, UA: NEGATIVE mg/dL
Hgb urine dipstick: NEGATIVE
Ketones, ur: NEGATIVE mg/dL
Nitrite: NEGATIVE
Protein, ur: NEGATIVE mg/dL
RBC / HPF: 0 RBC/hpf (ref 0–5)
Specific Gravity, Urine: 1.017 (ref 1.005–1.030)
pH: 6 (ref 5.0–8.0)

## 2023-07-01 LAB — CBC
HCT: 37.1 % (ref 36.0–46.0)
Hemoglobin: 11.5 g/dL — ABNORMAL LOW (ref 12.0–15.0)
MCH: 27.8 pg (ref 26.0–34.0)
MCHC: 31 g/dL (ref 30.0–36.0)
MCV: 89.6 fL (ref 80.0–100.0)
Platelets: 160 10*3/uL (ref 150–400)
RBC: 4.14 MIL/uL (ref 3.87–5.11)
RDW: 15.3 % (ref 11.5–15.5)
WBC: 7.8 10*3/uL (ref 4.0–10.5)
nRBC: 0 % (ref 0.0–0.2)

## 2023-07-01 LAB — BRAIN NATRIURETIC PEPTIDE: B Natriuretic Peptide: 43.4 pg/mL (ref 0.0–100.0)

## 2023-07-01 LAB — BASIC METABOLIC PANEL
Anion gap: 10 (ref 5–15)
BUN: 23 mg/dL (ref 8–23)
CO2: 23 mmol/L (ref 22–32)
Calcium: 8.5 mg/dL — ABNORMAL LOW (ref 8.9–10.3)
Chloride: 108 mmol/L (ref 98–111)
Creatinine, Ser: 1.01 mg/dL — ABNORMAL HIGH (ref 0.44–1.00)
GFR, Estimated: >60 mL/min (ref >60)
Glucose, Bld: 86 mg/dL (ref 70–99)
Potassium: 2.6 mmol/L — CL (ref 3.5–5.1)
Sodium: 141 mmol/L (ref 135–145)

## 2023-07-01 LAB — HEPATIC FUNCTION PANEL
ALT: 14 U/L (ref 0–44)
AST: 19 U/L (ref 15–41)
Albumin: 3.4 g/dL — ABNORMAL LOW (ref 3.5–5.0)
Alkaline Phosphatase: 114 U/L (ref 38–126)
Bilirubin, Direct: <0.1 mg/dL (ref 0.0–0.2)
Total Bilirubin: 0.3 mg/dL (ref <1.2)
Total Protein: 6 g/dL — ABNORMAL LOW (ref 6.5–8.1)

## 2023-07-01 LAB — TROPONIN I (HIGH SENSITIVITY)
Troponin I (High Sensitivity): 7 ng/L (ref <18)
Troponin I (High Sensitivity): 8 ng/L (ref <18)

## 2023-07-01 LAB — MAGNESIUM: Magnesium: 1.8 mg/dL (ref 1.7–2.4)

## 2023-07-01 MED ORDER — ASPIRIN 81 MG PO CHEW
324.0000 mg | CHEWABLE_TABLET | Freq: Once | ORAL | Status: AC
Start: 2023-07-01 — End: 2023-07-01
  Administered 2023-07-01: 324 mg via ORAL
  Filled 2023-07-01: qty 4

## 2023-07-01 MED ORDER — POTASSIUM CHLORIDE 10 MEQ/100ML IV SOLN
10.0000 meq | INTRAVENOUS | Status: AC
  Administered 2023-07-01 (×2): 10 meq via INTRAVENOUS
  Filled 2023-07-01 (×3): qty 100

## 2023-07-01 MED ORDER — IOHEXOL 350 MG/ML SOLN
75.0000 mL | Freq: Once | INTRAVENOUS | Status: AC | PRN
  Administered 2023-07-01: 75 mL via INTRAVENOUS

## 2023-07-01 MED ORDER — POTASSIUM CHLORIDE CRYS ER 20 MEQ PO TBCR
40.0000 meq | EXTENDED_RELEASE_TABLET | Freq: Once | ORAL | Status: AC
  Administered 2023-07-01: 40 meq via ORAL
  Filled 2023-07-01: qty 2

## 2023-07-01 NOTE — ED Triage Notes (Signed)
Pt presents to ER from home with complaints of chest pain for the past 2 weeks, reports pain has been on and off but yesterday she developed heaviness on her chest that has not gone away. Pt also reports lower extremity swelling. Does have a history of CHF and takes Lasix once a day. Pt talks in complete sentences no respiratory distress noted

## 2023-07-01 NOTE — ED Provider Notes (Signed)
[facility] Provider Note    Event Date/Time   First MD Initiated Contact with Patient 07/01/23 1351     (approximate)   History   Chest Pain   HPI  Katie Leon is a 67 y.o. female with a history of CHF who comes in with concerns for leg swelling and shortness of breath and chest pain.  Patient reports intermittent chest pain over the past 2 weeks but having worsening pain last night.  She reports it is in the middle of her chest.  She is associated with some shortness of breath.  Reports that came on at rest.  She states that she does take Lasix a small dose she is not exactly sure how much that she was diagnosed with CHF but she denies ever having swelling before.  She noticed some increasing swelling in her bilateral legs worse on the right.  She denies any history of blood clots.  Physical Exam   Triage Vital Signs: ED Triage Vitals  Encounter Vitals Group     BP 07/01/23 1331 127/67     Systolic BP Percentile --      Diastolic BP Percentile --      Pulse Rate 07/01/23 1331 80     Resp 07/01/23 1331 16     Temp 07/01/23 1331 98.4 F (36.9 C)     Temp Source 07/01/23 1331 Oral     SpO2 07/01/23 1331 96 %     Weight 07/01/23 1329 230 lb (104.3 kg)     Height 07/01/23 1329 5\' 3"  (1.6 m)     Head Circumference --      Peak Flow --      Pain Score 07/01/23 1329 10     Pain Loc --      Pain Education --      Exclude from Growth Chart --     Most recent vital signs: Vitals:   07/01/23 1331  BP: 127/67  Pulse: 80  Resp: 16  Temp: 98.4 F (36.9 C)  SpO2: 96%     General: Awake, no distress.  CV:  Good peripheral perfusion.  Resp:  Normal effort.  Abd:  No distention.  Soft nontender Other:  Some swelling noted in bilateral legs worse on the right. Equal radial pulses.  Equal DP pulses.  Sensation intact throughout  ED Results / Procedures / Treatments   Labs (all labs ordered are listed, but only abnormal results are  displayed) Labs Reviewed  CBC - Abnormal; Notable for the following components:      Result Value   Hemoglobin 11.5 (*)    All other components within normal limits  BASIC METABOLIC PANEL  BRAIN NATRIURETIC PEPTIDE  HEPATIC FUNCTION PANEL  TROPONIN I (HIGH SENSITIVITY)     EKG  My interpretation of EKG:  Normal sinus rate of 80 without any ST elevation, T wave version in V2 and V3, PVCs.  Looks that she has had similar T wave inversions previously  RADIOLOGY I have reviewed the xray personally and interpreted and some mild lower lobe scarring but no obvious pneumonia  PROCEDURES:  Critical Care performed: No  Procedures   MEDICATIONS ORDERED IN ED: Medications  aspirin chewable tablet 324 mg (has no administration in time range)  potassium chloride SA (KLOR-CON M) CR tablet 40 mEq (has no administration in time range)  potassium chloride 10 mEq in 100 mL IVPB (has no administration in time range)     IMPRESSION / MDM /  ASSESSMENT AND PLAN / ED COURSE  I reviewed the triage vital signs and the nursing notes.   Patient's presentation is most consistent with acute presentation with potential threat to life or bodily function.   Patient comes in with concerns for chest pain, shortness of breath, leg swelling.  Workup will be done to evaluate for CHF, ultrasound evaluate for DVT, cardiac markers to evaluate for ACS.  Will get liver test to make sure no signs of cirrhosis, urine to evaluate for nephrotic syndrome  Chest x-ray without any edema BNP normal initial troponin is negative.  Her potassium is significantly low we will give some IV and oral repletion.  Given she does have multiple PVCs.  Given no clear source for shortness of breath and swelling will get CT PE evaluate for pulmonary embolism patient will be handed off to oncoming team pending these results  The patient is on the cardiac monitor to evaluate for evidence of arrhythmia and/or significant heart rate  changes.      FINAL CLINICAL IMPRESSION(S) / ED DIAGNOSES   Final diagnoses:  Nonspecific chest pain     Rx / DC Orders   ED Discharge Orders     None        Note:  This document was prepared using Dragon voice recognition software and may include unintentional dictation errors.   Concha Se, MD 07/01/23 1450

## 2023-07-01 NOTE — ED Provider Notes (Signed)
-----------------------------------------   3:22 PM on 07/01/2023 -----------------------------------------  Blood pressure 127/67, pulse 80, temperature 98.4 F (36.9 C), temperature source Oral, resp. rate 16, height 5\' 3"  (1.6 m), weight 104.3 kg, SpO2 96%.  Assuming care from Dr. Fuller Plan.  In short, Katie Leon is a 67 y.o. female with a chief complaint of Chest Pain .  Refer to the original H&P for additional details.  The current plan of care is to follow-up results of CTA chest and repeat troponin.  ----------------------------------------- 5:23 PM on 07/01/2023 ----------------------------------------- Repeat troponin within normal limits and CTA chest is negative for PE or other acute process.  Patient appropriate for discharge home with PCP follow-up, was counseled to return to the ED for new or worsening symptoms.  Patient agrees with plan.    Chesley Noon, MD 07/01/23 1723

## 2023-07-01 NOTE — ED Notes (Signed)
Patient transported to Ultrasound 

## 2024-06-09 ENCOUNTER — Emergency Department

## 2024-06-09 ENCOUNTER — Observation Stay
Admission: EM | Admit: 2024-06-09 | Discharge: 2024-06-11 | Disposition: A | Discharge status: Home / Self Care | Attending: Internal Medicine | Admitting: Internal Medicine

## 2024-06-09 ENCOUNTER — Other Ambulatory Visit: Payer: Self-pay

## 2024-06-09 DIAGNOSIS — E669 Obesity, unspecified: Secondary | ICD-10-CM | POA: Diagnosis not present

## 2024-06-09 DIAGNOSIS — H8110 Benign paroxysmal vertigo, unspecified ear: Principal | ICD-10-CM | POA: Insufficient documentation

## 2024-06-09 DIAGNOSIS — Z79899 Other long term (current) drug therapy: Secondary | ICD-10-CM | POA: Insufficient documentation

## 2024-06-09 DIAGNOSIS — R42 Dizziness and giddiness: Principal | ICD-10-CM

## 2024-06-09 DIAGNOSIS — E876 Hypokalemia: Secondary | ICD-10-CM | POA: Insufficient documentation

## 2024-06-09 DIAGNOSIS — Z6838 Body mass index (BMI) 38.0-38.9, adult: Secondary | ICD-10-CM | POA: Insufficient documentation

## 2024-06-09 DIAGNOSIS — I10 Essential (primary) hypertension: Secondary | ICD-10-CM | POA: Diagnosis present

## 2024-06-09 LAB — URINALYSIS, ROUTINE W REFLEX MICROSCOPIC
Bilirubin Urine: NEGATIVE
Glucose, UA: NEGATIVE mg/dL
Hgb urine dipstick: NEGATIVE
Ketones, ur: NEGATIVE mg/dL
Leukocytes,Ua: NEGATIVE
Nitrite: NEGATIVE
Protein, ur: NEGATIVE mg/dL
Specific Gravity, Urine: 1.015 (ref 1.005–1.030)
pH: 6 (ref 5.0–8.0)

## 2024-06-09 LAB — BASIC METABOLIC PANEL WITH GFR
Anion gap: 13 (ref 5–15)
BUN: 14 mg/dL (ref 8–23)
CO2: 21 mmol/L — ABNORMAL LOW (ref 22–32)
Calcium: 9.4 mg/dL (ref 8.9–10.3)
Chloride: 103 mmol/L (ref 98–111)
Creatinine, Ser: 0.81 mg/dL (ref 0.44–1.00)
GFR, Estimated: >60 mL/min (ref >60)
Glucose, Bld: 125 mg/dL — ABNORMAL HIGH (ref 70–99)
Potassium: 3.3 mmol/L — ABNORMAL LOW (ref 3.5–5.1)
Sodium: 137 mmol/L (ref 135–145)

## 2024-06-09 LAB — CBC
HCT: 42.8 % (ref 36.0–46.0)
Hemoglobin: 13.5 g/dL (ref 12.0–15.0)
MCH: 27.7 pg (ref 26.0–34.0)
MCHC: 31.5 g/dL (ref 30.0–36.0)
MCV: 87.9 fL (ref 80.0–100.0)
Platelets: 215 K/uL (ref 150–400)
RBC: 4.87 MIL/uL (ref 3.87–5.11)
RDW: 14.6 % (ref 11.5–15.5)
WBC: 6.6 K/uL (ref 4.0–10.5)
nRBC: 0 % (ref 0.0–0.2)

## 2024-06-09 MED ORDER — SODIUM CHLORIDE 0.9 % IV BOLUS (SEPSIS)
1000.0000 mL | Freq: Once | INTRAVENOUS | Status: AC
  Administered 2024-06-10: 1000 mL via INTRAVENOUS

## 2024-06-09 MED ORDER — POTASSIUM CHLORIDE 20 MEQ PO PACK
40.0000 meq | PACK | Freq: Once | ORAL | Status: AC
  Administered 2024-06-10: 40 meq via ORAL
  Filled 2024-06-09: qty 2

## 2024-06-09 MED ORDER — MECLIZINE HCL 25 MG PO TABS
25.0000 mg | ORAL_TABLET | Freq: Once | ORAL | Status: AC
  Administered 2024-06-10: 25 mg via ORAL
  Filled 2024-06-09: qty 1

## 2024-06-09 NOTE — ED Provider Notes (Signed)
 [facility] Provider Note    Event Date/Time   First MD Initiated Contact with Patient 06/09/24 2310     (approximate)   History   Dizziness   HPI  Katie Leon is a 68 y.o. female with history of hypertension who presents to the emergency department with complaints of dizziness that has been ongoing since Tuesday 06/06/24.  She describes it as feeling like the room is spinning that is worse with turning her head, standing.  No headache, head injury.  No numbness, tingling or focal weakness.  Has never had vertigo before.  No prior history of stroke.  She denies chest pain or shortness of breath but had has palpitations.  No vomiting or diarrhea.  Also complaining of discomfort that she has had in the right popliteal fossa.  No injury to the leg.  No history of PE or DVT.  No calf tenderness or calf swelling.  Able to ambulate.   History provided by patient.    Past Medical History:  Diagnosis Date   Arthritis    Gallstones    Heart disease    Hypertension    Pancreatitis     Past Surgical History:  Procedure Laterality Date   ABDOMINAL HYSTERECTOMY     Bilateral knee replacements  Bilateral    CARDIAC CATHETERIZATION     CHOLECYSTECTOMY N/A 04/19/2017   Procedure: LAPAROSCOPIC CHOLECYSTECTOMY;  Surgeon: Nicholaus Selinda Birmingham, MD;  Location: [facility] ORS;  Service: General;  Laterality: N/A;   COLONOSCOPY WITH PROPOFOL  N/A 10/24/2021   Procedure: COLONOSCOPY WITH PROPOFOL ;  Surgeon: Unk Corinn Skiff, MD;  Location: [facility] ENDOSCOPY;  Service: Gastroenterology;  Laterality: N/A;   GASTROPLASTY VERTICAL BANDED  1999   Hip replacement x2  Left    JOINT REPLACEMENT     Shouulder surgery  Bilateral    Rotator Cuff Repair   TONSILLECTOMY     Adnoidectomy    MEDICATIONS:  Prior to Admission medications   Medication Sig Start Date End Date Taking? Authorizing Provider  amLODipine (NORVASC) 10 MG tablet Take 10 mg by mouth daily. 10/11/18 10/24/21   [provider]  carvedilol (COREG) 6.25 MG tablet Take 6.25 mg by mouth 2 (two) times daily with a meal. 10/11/18 10/24/21  [provider]  diltiazem  (CARDIZEM  CD) 300 MG 24 hr capsule Take 300 mg by mouth daily. 02/22/17   [provider]  diphenhydrAMINE -zinc  acetate (BENADRYL  EXTRA STRENGTH) cream Apply 1 application topically 3 (three) times daily as needed for itching. 08/21/17   Trudy Dorn BRAVO, MD  esomeprazole (NEXIUM) 40 MG capsule Take 40 mg by mouth 2 (two) times daily before a meal.  02/24/18   [provider]  furosemide  (LASIX ) 20 MG tablet Take 20 mg by mouth daily. 09/21/18   [provider]  hydrALAZINE  (APRESOLINE ) 25 MG tablet Take 1 tablet (25 mg total) by mouth 4 (four) times daily. 05/20/18 10/24/21  Almeda Bernard, MD    Physical Exam   Triage Vital Signs: ED Triage Vitals  Encounter Vitals Group     BP 06/09/24 1830 (!) 148/86     Girls Systolic BP Percentile --      Girls Diastolic BP Percentile --      Boys Systolic BP Percentile --      Boys Diastolic BP Percentile --      Pulse Rate 06/09/24 1830 60     Resp 06/09/24 1830 20     Temp 06/09/24 1830 98 F (36.7 C)  Temp src --      SpO2 06/09/24 1830 100 %     Weight 06/09/24 1829 220 lb (99.8 kg)     Height 06/09/24 1829 5' 3 (1.6 m)     Head Circumference --      Peak Flow --      Pain Score 06/09/24 1828 5     Pain Loc --      Pain Education --      Exclude from Growth Chart --     Most recent vital signs: Vitals:   06/10/24 0100 06/10/24 0418  BP: 106/60 112/68  Pulse: 62 64  Resp:  18  Temp:  97.9 F (36.6 C)  SpO2:  99%    CONSTITUTIONAL: Alert, responds appropriately to questions. Well-appearing; well-nourished HEAD: Normocephalic, atraumatic EYES: Conjunctivae clear, pupils appear equal, sclera nonicteric ENT: normal nose; moist mucous membranes NECK: Supple, normal ROM CARD: RRR; S1 and S2 appreciated RESP: Normal chest  excursion without splinting or tachypnea; breath sounds clear and equal bilaterally; no wheezes, no rhonchi, no rales, no hypoxia or respiratory distress, speaking full sentences ABD/GI: Non-distended; soft, non-tender, no rebound, no guarding, no peritoneal signs BACK: The back appears normal EXT: Normal ROM in all joints; no deformity noted, no edema SKIN: Normal color for age and race; warm; no rash on exposed skin NEURO: Moves all extremities equally, normal speech, normal sensation, no facial asymmetry PSYCH: The patient's mood and manner are appropriate.   ED Results / Procedures / Treatments   LABS: (all labs ordered are listed, but only abnormal results are displayed) Labs Reviewed  BASIC METABOLIC PANEL WITH GFR - Abnormal; Notable for the following components:      Result Value   Potassium 3.3 (*)    CO2 21 (*)    Glucose, Bld 125 (*)    All other components within normal limits  URINALYSIS, ROUTINE W REFLEX MICROSCOPIC - Abnormal; Notable for the following components:   Color, Urine YELLOW (*)    APPearance CLEAR (*)    All other components within normal limits  CBC  MAGNESIUM  TSH  HIV ANTIBODY (ROUTINE TESTING W REFLEX)  TROPONIN I (HIGH SENSITIVITY)     EKG:  EKG Interpretation Date/Time:  Friday June 09 2024 18:37:40 EST Ventricular Rate:  58 PR Interval:  154 QRS Duration:  92 QT Interval:  468 QTC Calculation: 459 R Axis:   0  Text Interpretation: Sinus bradycardia Possible Anterior infarct (cited on or before 18-May-2018) Abnormal ECG When compared with ECG of 01-Jul-2023 13:30, Premature ventricular complexes are no longer Present Confirmed by Neomi Neptune [id number]) on 06/09/2024 11:38:06 PM         RADIOLOGY: My personal review and interpretation of imaging: MRI shows no stroke.  I have personally reviewed all radiology reports.   MR BRAIN WO CONTRAST Result Date: 06/10/2024 EXAM: MRI BRAIN WITHOUT CONTRAST 06/10/2024 04:09:27 AM TECHNIQUE:  Multiplanar multisequence MRI of the head/brain was performed without the administration of intravenous contrast. COMPARISON: Head CT yesterday, 06/09/2024. CLINICAL HISTORY: 68 year old female with persistent dizziness. FINDINGS: Susceptibility artifact around the left base and orbits of unclear etiology and significance, mildly degrades DWI and SWI on this exam. BRAIN AND VENTRICLES: No acute infarct. No intracranial hemorrhage. No mass. No midline shift. No hydrocephalus. Brain volume within normal limits for age. Largely normal for age gray and white matter signal throughout the brain. Minimal nonspecific white matter T2 and FLAIR hyperintensity. No cortical encephalomalacia. No chronic cerebral blood products. The sella is  unremarkable. Normal flow voids. ORBITS: No acute abnormality. SINUSES AND MASTOIDS: Mastoids are clear. Grossly normal visible internal auditory structures. No acute abnormality. BONES AND SOFT TISSUES: marrow signal Within normal limits Negative for age visible cervical spine bone marrow signal is within normal limits. Susceptibility artifact along the anterior and left face (relevant to soft tissues). IMPRESSION: 1. No acute intracranial abnormality. Normal for age non-contrast MRI appearance of the brain. 2. Unknown Susceptibility artifact around the left face and orbits, mildly degrading exam. Electronically signed by: Helayne Hurst MD 06/10/2024 04:19 AM EST RP Workstation: [id number]   US  Venous Img Lower Unilateral Right Result Date: 06/10/2024 EXAM: ULTRASOUND DUPLEX OF THE RIGHT LOWER EXTREMITY VEINS TECHNIQUE: Duplex ultrasound using B-mode/gray scaled imaging and Doppler spectral analysis and color flow was obtained of the deep venous structures of the right lower extremity. COMPARISON: None available. CLINICAL HISTORY: Pain in popliteal fossa. FINDINGS: The common femoral vein, femoral vein, popliteal vein, and posterior tibial vein of the right lower extremity demonstrate  normal compressibility with normal color flow and spectral analysis. IMPRESSION: 1. No evidence of DVT. Electronically signed by: Norman Gatlin MD 06/10/2024 12:48 AM EST RP Workstation: [id number]   CT HEAD WO CONTRAST ( ) Result Date: 06/09/2024 CLINICAL DATA:  Dizziness EXAM: CT HEAD WITHOUT CONTRAST TECHNIQUE: Contiguous axial images were obtained from the base of the skull through the vertex without intravenous contrast. RADIATION DOSE REDUCTION: This exam was performed according to the departmental dose-optimization program which includes automated exposure control, adjustment of the mA and/or kV according to patient size and/or use of iterative reconstruction technique. COMPARISON:  None Available. FINDINGS: Brain: No acute territorial infarction, hemorrhage or intracranial mass. The ventricles are nonenlarged. Vascular: No hyperdense vessels.  Carotid vascular calcification Skull: Normal. Negative for fracture or focal lesion. Sinuses/Orbits: No acute finding. Other: None IMPRESSION: Negative non contrasted CT appearance of the brain. Electronically Signed   By: Luke Bun M.D.   On: 06/09/2024 21:20     PROCEDURES:  Critical Care performed: No      .1-3 Lead EKG Interpretation  Performed by: Karilyn Wind, Josette SAILOR, DO Authorized by: Jlen Wintle, Josette SAILOR, DO     Interpretation: normal     ECG rate:  59   ECG rate assessment: normal     Rhythm: sinus rhythm     Ectopy: none     Conduction: normal       IMPRESSION / MDM / ASSESSMENT AND PLAN / ED COURSE  I reviewed the triage vital signs and the nursing notes.    Patient here with vertigo ongoing for 3 to 4 days.  Also complaining of pain behind the right knee.  The patient is on the cardiac monitor to evaluate for evidence of arrhythmia and/or significant heart rate changes.   DIFFERENTIAL DIAGNOSIS (includes but not limited to):   Peripheral vertigo, Mnire's disease, stroke, anemia, electrolyte derangement, dehydration,  UTI, thyroid  dysfunction  DVT, Baker's cyst, knee strain/sprain, doubt fracture, cellulitis, septic arthritis, gout   Patient's presentation is most consistent with acute presentation with potential threat to life or bodily function.   PLAN: Will obtain labs, urine, DVT study of the right lower extremity.  Will give meclizine, IV fluids.   MEDICATIONS GIVEN IN ED: Medications  0.9 %  sodium chloride  infusion ( Intravenous New Bag/Given 06/10/24 0551)  enoxaparin  (LOVENOX ) injection 50 mg (has no administration in time range)  acetaminophen  (TYLENOL ) tablet 650 mg (has no administration in time range)    Or  acetaminophen  (TYLENOL ) suppository  650 mg (has no administration in time range)  ondansetron  (ZOFRAN ) tablet 4 mg (has no administration in time range)    Or  ondansetron  (ZOFRAN ) injection 4 mg (has no administration in time range)  HYDROcodone -acetaminophen  (NORCO/VICODIN) 5-325 MG per tablet 1-2 tablet (has no administration in time range)  meclizine (ANTIVERT) tablet 25 mg (has no administration in time range)  dexamethasone  (DECADRON ) injection 10 mg (has no administration in time range)  sodium chloride  0.9 % bolus 1,000 mL (0 mLs Intravenous Stopped 06/10/24 0418)  meclizine (ANTIVERT) tablet 25 mg (25 mg Oral Given 06/10/24 0029)  potassium chloride  (KLOR-CON ) packet 40 mEq (40 mEq Oral Given 06/10/24 0029)  diazepam  (VALIUM ) injection 2.5 mg (2.5 mg Intravenous Given 06/10/24 0211)  meclizine (ANTIVERT) tablet 25 mg (25 mg Oral Given 06/10/24 0547)     ED COURSE: Potassium 3.3.  Given oral replacement.  No cardiac events seen on cardiac monitoring.  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  Troponin negative.  She denies any chest pain or shortness of breath.  Normal magnesium, TSH.  Urine shows no sign of infection or dehydration.  CT head reviewed and interpreted by myself and radiologist and is unremarkable.  Patient still very ataxic after IV fluids and meclizine.  Will give Valium  and  obtain MRI brain to evaluate for possible stroke.  She is outside tPA window given symptoms started several days ago.    MRI brain reviewed and interpreted by myself and the radiologist and shows no stroke.  Patient however still extremely symptomatic even after IV fluids, meclizine, Valium  and almost fell getting up to try to go to the bathroom.  Given she is not able to ambulate safely, will admit for symptomatic management for peripheral vertigo.  Will give additional meclizine, IV fluids.   CONSULTS:  Consulted and discussed patient's case with hospitalist, Dr. Cleatus.  I have recommended admission and consulting physician agrees and will place admission orders.  Patient (and family if present) agree with this plan.   I reviewed all nursing notes, vitals, pertinent previous records.  All labs, EKGs, imaging ordered have been independently reviewed and interpreted by myself.    OUTSIDE RECORDS REVIEWED: Reviewed last orthopedic note.       FINAL CLINICAL IMPRESSION(S) / ED DIAGNOSES   Final diagnoses:  Vertigo     Rx / DC Orders   ED Discharge Orders          Ordered    meclizine (ANTIVERT) 25 MG tablet  3 times daily PRN        06/10/24 0455    ondansetron  (ZOFRAN -ODT) 4 MG disintegrating tablet  Every 6 hours PRN        06/10/24 0455             Note:  This document was prepared using Dragon voice recognition software and may include unintentional dictation errors.   Brailyn Killion, Josette SAILOR, DO 06/10/24 [phone]

## 2024-06-09 NOTE — ED Triage Notes (Signed)
 Pt to ED for dizziness for a few days, worsens with movement. Also reports right leg pain.

## 2024-06-10 ENCOUNTER — Encounter: Payer: Self-pay | Admitting: Internal Medicine

## 2024-06-10 ENCOUNTER — Emergency Department

## 2024-06-10 DIAGNOSIS — R42 Dizziness and giddiness: Secondary | ICD-10-CM | POA: Diagnosis not present

## 2024-06-10 LAB — HIV ANTIBODY (ROUTINE TESTING W REFLEX): HIV Screen 4th Generation wRfx: NONREACTIVE

## 2024-06-10 LAB — TROPONIN I (HIGH SENSITIVITY): Troponin I (High Sensitivity): 5 ng/L (ref <18)

## 2024-06-10 LAB — TSH: TSH: 0.676 u[IU]/mL (ref 0.350–4.500)

## 2024-06-10 LAB — MAGNESIUM: Magnesium: 1.9 mg/dL (ref 1.7–2.4)

## 2024-06-10 MED ORDER — MECLIZINE HCL 25 MG PO TABS
25.0000 mg | ORAL_TABLET | Freq: Three times a day (TID) | ORAL | Status: DC | PRN
  Administered 2024-06-10: 25 mg via ORAL
  Filled 2024-06-10 (×3): qty 1

## 2024-06-10 MED ORDER — ENOXAPARIN SODIUM 60 MG/0.6ML IJ SOSY
0.5000 mg/kg | PREFILLED_SYRINGE | INTRAMUSCULAR | Status: DC
  Administered 2024-06-10 – 2024-06-11 (×2): 50 mg via SUBCUTANEOUS
  Filled 2024-06-10 (×2): qty 0.6

## 2024-06-10 MED ORDER — ONDANSETRON HCL 4 MG PO TABS: 4.0000 mg | ORAL_TABLET | Freq: Four times a day (QID) | ORAL | Status: DC | PRN

## 2024-06-10 MED ORDER — DEXAMETHASONE SOD PHOSPHATE PF 10 MG/ML IJ SOLN
10.0000 mg | Freq: Once | INTRAMUSCULAR | Status: AC
  Administered 2024-06-10: 10 mg via INTRAVENOUS

## 2024-06-10 MED ORDER — HYDROCODONE-ACETAMINOPHEN 5-325 MG PO TABS: 1.0000 | ORAL_TABLET | ORAL | Status: DC | PRN

## 2024-06-10 MED ORDER — SODIUM CHLORIDE 0.9 % IV SOLN: INTRAVENOUS | Status: DC

## 2024-06-10 MED ORDER — ONDANSETRON HCL 4 MG/2ML IJ SOLN: 4.0000 mg | Freq: Four times a day (QID) | INTRAMUSCULAR | Status: DC | PRN

## 2024-06-10 MED ORDER — MECLIZINE HCL 25 MG PO TABS: 25.0000 mg | ORAL_TABLET | Freq: Three times a day (TID) | ORAL | 0 refills | Status: AC | PRN

## 2024-06-10 MED ORDER — MECLIZINE HCL 25 MG PO TABS
25.0000 mg | ORAL_TABLET | Freq: Once | ORAL | Status: AC
  Administered 2024-06-10: 25 mg via ORAL
  Filled 2024-06-10: qty 1

## 2024-06-10 MED ORDER — ACETAMINOPHEN 650 MG RE SUPP
650.0000 mg | Freq: Four times a day (QID) | RECTAL | Status: DC | PRN
Start: 2024-06-10 — End: 2024-06-11

## 2024-06-10 MED ORDER — ONDANSETRON 4 MG PO TBDP: 4.0000 mg | ORAL_TABLET | Freq: Four times a day (QID) | ORAL | 0 refills | Status: AC | PRN

## 2024-06-10 MED ORDER — DIAZEPAM 5 MG/ML IJ SOLN
2.5000 mg | Freq: Once | INTRAMUSCULAR | Status: AC
  Administered 2024-06-10: 2.5 mg via INTRAVENOUS
  Filled 2024-06-10: qty 2

## 2024-06-10 MED ORDER — ACETAMINOPHEN 325 MG PO TABS: 650.0000 mg | ORAL_TABLET | Freq: Four times a day (QID) | ORAL | Status: DC | PRN

## 2024-06-10 NOTE — Evaluation (Signed)
 Physical Therapy Evaluation Patient Details Name: Katie Leon MRN: [id number] DOB: Nov 28, 1955 Today's Date: 06/10/2024  History of Present Illness  Katie Leon is a pleasant 68 y.o. female with medical history significant for HTN who presented to ED complaining of dizziness that has been ongoing since Tuesday, 06/06/2024.  She describes it as feeling like the room is spinning that is worse with turning her head and standing.  She denied similar history in the past.  She denies any history of head injury.  She denies any fever, headache, tingling, numbness.  She denies any history of prior stroke.  Clinical Impression  Patient noted to be in supine position at PT arrival in room, for an initial PT evaluation due to a decline in functional status, with baseline mobility reported as independent, and currently requiring minA/supervision for transfers and ambulation. The patient is A&O x 4, presenting with good willingness to work with PT and goals of getting back independent, with discharge expectations that include going home. The patient resides in a independent living facility and lives alone with family/friend support. There are no steps to enter and inside the residence. Gait was assessed with RW, limited by dizziness states that she needs to keep her head down. Gait mechanic observations noted decreased gait speed, shuffled gait and R/L drift. Clinical impression is that the patient presents with moderate  mobility limitations secondary to vertigo/dizziness symptoms. Recommended skilled PT will address safety, mobility, and discharge planning. PT recommendation to d/c patient to HHPT upon medical clearance.   Sitting assessment performed: The examination findings are consistent with the sign and symptoms of Cervicogenic Dizziness (CGD); further PT testing needed to rule out vestibular/BPPV  VISUAL TESTING: (+) visual tracking indicates some visual origin with difficultly with gaze fixation; (+)  saccades R and L R>L symptom onset  VESTIBULAR: (-) Rhomberg  CANAL TESTING: Deferred to later time; onset of symptom without nystagmus with cervicogenic and visual testing ROM: limited R/L cervical rotation with slight onset of symptoms at end range;  PALPATION: upper cervical spine R suboccipitals and upper cervical paraspinals C1 - C3 elicited most pain and concordinante symptoms with associated nausea and winching; palpation of R uppper trap elicited similiar symptoms       If plan is discharge home, recommend the following: A little help with walking and/or transfers;A little help with bathing/dressing/bathroom   Can travel by private vehicle        Equipment Recommendations Rolling walker (2 wheels)  Recommendations for Other Services       Functional Status Assessment Patient has had a recent decline in their functional status and demonstrates the ability to make significant improvements in function in a reasonable and predictable amount of time.     Precautions / Restrictions Precautions Precautions: None Restrictions Weight Bearing Restrictions Per Provider Order: No      Mobility  Bed Mobility Overal bed mobility: Needs Assistance Bed Mobility: Rolling, Sidelying to Sit Rolling: Supervision Sidelying to sit: Supervision            Transfers Overall transfer level: Needs assistance Equipment used: Rolling walker (2 wheels) Transfers: Sit to/from Stand Sit to Stand: Supervision, Contact guard assist           General transfer comment: sitting assessment performed: limited R/L cervical rotation with slight onset of symptoms at end range; visual tracking indicates some visual origin with difficultly with gaze fixation; Palpation of upper cervical spine R suboccipitals and upper cervical paraspinals C1 - C3 elicited most pain  and concordinante symptoms with associated nausea and winching; palpation of R uppper trap elicited similiar symptoms     Ambulation/Gait Ambulation/Gait assistance: Min assist, Contact guard assist Gait Distance (Feet): 50 Feet Assistive device: Rolling walker (2 wheels) Gait Pattern/deviations: Step-through pattern, Trunk flexed, Drifts right/left, Decreased stride length       General Gait Details: occasional wall surf for balance with RW  Stairs            Wheelchair Mobility     Tilt Bed    Modified Rankin (Stroke Patients Only)       Balance Overall balance assessment: Needs assistance Sitting-balance support: Feet unsupported Sitting balance-Leahy Scale: Fair   Postural control: Right lateral lean Standing balance support: Reliant on assistive device for balance, During functional activity Standing balance-Leahy Scale: Poor                               Pertinent Vitals/Pain Pain Assessment Pain Assessment: 0-10 Pain Score: 4  Pain Location: neck pain on R side with palpation Pain Descriptors / Indicators: Aching, Sore Pain Intervention(s): Limited activity within patient's tolerance, Monitored during session, Repositioned    Home Living Family/patient expects to be discharged to:: Private residence Living Arrangements: Alone;Children (3 children) Available Help at Discharge: Family Type of Home: House Home Access: Elevator       Home Layout: Able to live on main level with bedroom/bathroom Home Equipment: None      Prior Function Prior Level of Function : Independent/Modified Independent             Mobility Comments: independent for all mobility and transfers ADLs Comments: independent for all ADLs     Extremity/Trunk Assessment        Lower Extremity Assessment Lower Extremity Assessment: Generalized weakness    Cervical / Trunk Assessment Cervical / Trunk Assessment: Kyphotic  Communication   Communication Communication: No apparent difficulties    Cognition Arousal: Alert Behavior During Therapy: WFL for tasks  assessed/performed   PT - Cognitive impairments: No apparent impairments                         Following commands: Intact       Cueing Cueing Techniques: Verbal cues     General Comments      Exercises     Assessment/Plan    PT Assessment Patient needs continued PT services  PT Problem List Decreased range of motion;Decreased activity tolerance;Decreased mobility       PT Treatment Interventions Functional mobility training;Therapeutic activities;Therapeutic exercise;Patient/family education;Neuromuscular re-education;Manual techniques    PT Goals (Current goals can be found in the Care Plan section)  Acute Rehab PT Goals Patient Stated Goal: Pt wants to go home PT Goal Formulation: With patient Time For Goal Achievement: 06/24/24 Potential to Achieve Goals: Good    Frequency Min 2X/week     Co-evaluation               AM-PAC PT 6 Clicks Mobility  Outcome Measure Help needed turning from your back to your side while in a flat bed without using bedrails?: A Little Help needed moving from lying on your back to sitting on the side of a flat bed without using bedrails?: A Little Help needed moving to and from a bed to a chair (including a wheelchair)?: A Little Help needed standing up from a chair using your arms (e.g., wheelchair or bedside chair)?:  A Little Help needed to walk in hospital room?: A Little Help needed climbing 3-5 steps with a railing? : A Little 6 Click Score: 18    End of Session Equipment Utilized During Treatment: Gait belt Activity Tolerance: Treatment limited secondary to medical complications (Comment) (limited by dizziness but symptoms not exacerbatted by mobility) Patient left: in chair;with chair alarm set Nurse Communication: Mobility status PT Visit Diagnosis: Other symptoms and signs involving the nervous system (R29.898);Other abnormalities of gait and mobility (R26.89)    Time: 1430-1505 PT Time Calculation  (min) (ACUTE ONLY): 35 min   Charges:   PT Evaluation $PT Eval Low Complexity: 1 Low   PT General Charges $$ ACUTE PT VISIT: 1 Visit         Sherlean Lesches DPT, PT    Mataya Kilduff A Yovana Scogin 06/10/2024, 3:41 PM

## 2024-06-10 NOTE — H&P (Addendum)
 History and Physical    Katie Leon FMW:[id number] DOB: 06-03-1956 DOA: 06/09/2024  DOS: the patient was seen and examined on 06/09/2024  PCP: Vicci Mliss SAUNDERS, FNP   Patient coming from: Home  I have personally briefly reviewed patient's old medical records in [facility]  Chief Complaint: Dizziness  HPI: Katie Leon is a pleasant 68 y.o. female with medical history significant for HTN who presented to ED complaining of dizziness that has been ongoing since Tuesday, 06/06/2024.  She describes it as feeling like the room is spinning that is worse with turning her head and standing.  She denied similar history in the past.  She denies any history of head injury.  She denies any fever, headache, tingling, numbness.  She denies any history of prior stroke.  ED Course: Upon arrival to the ED, patient is found to be hypertensive around 148/86 potassium 3.3 EKG showed sinus rhythm at 58 bpm MRI of the brain showed no acute intracranial abnormality, right lower extremity Doppler showed no evidence of DVT.  Patient was given multiple doses of meclizine, dexamethasone , IV fluid potassium, diazepam , Zofran  still patient's symptoms did not improve.  Hospitalist service was consulted for evaluation for admission for intractable vertigo.  Review of Systems:  ROS  All other systems negative except as noted in the HPI.  Past Medical History:  Diagnosis Date   Arthritis    Gallstones    Heart disease    Hypertension    Pancreatitis     Past Surgical History:  Procedure Laterality Date   ABDOMINAL HYSTERECTOMY     Bilateral knee replacements  Bilateral    CARDIAC CATHETERIZATION     CHOLECYSTECTOMY N/A 04/19/2017   Procedure: LAPAROSCOPIC CHOLECYSTECTOMY;  Surgeon: Nicholaus Selinda Birmingham, MD;  Location: [facility] ORS;  Service: General;  Laterality: N/A;   COLONOSCOPY WITH PROPOFOL  N/A 10/24/2021   Procedure: COLONOSCOPY WITH PROPOFOL ;  Surgeon: Unk Corinn Skiff, MD;  Location: [facility] ENDOSCOPY;   Service: Gastroenterology;  Laterality: N/A;   GASTROPLASTY VERTICAL BANDED  1999   Hip replacement x2  Left    JOINT REPLACEMENT     Shouulder surgery  Bilateral    Rotator Cuff Repair   TONSILLECTOMY     Adnoidectomy     reports that she has never smoked. She has never used smokeless tobacco. She reports that she does not drink alcohol and does not use drugs.  No Known Allergies  Family History  Problem Relation Age of Onset   Diabetes Father    Kidney failure Father    Diabetes Brother    Kidney failure Brother    Kidney failure Mother    Diabetes Mellitus II Mother    Diabetes Mellitus II Sister    Kidney failure Sister    Breast cancer Neg Hx     Prior to Admission medications   Medication Sig Start Date End Date Taking? Authorizing Provider  amLODipine (NORVASC) 10 MG tablet Take 10 mg by mouth daily. 10/11/18 06/10/24 Yes [provider]  carvedilol (COREG) 6.25 MG tablet Take 6.25 mg by mouth 2 (two) times daily with a meal. 10/11/18 06/10/24 Yes [provider]  colchicine 0.6 MG tablet Take 0.6 mg by mouth daily.   Yes [provider]  esomeprazole (NEXIUM) 40 MG capsule Take 40 mg by mouth 2 (two) times daily before a meal.  02/24/18  Yes [provider]  furosemide  (LASIX ) 20 MG tablet Take 20 mg by mouth daily. 09/21/18  Yes [provider]  hydrALAZINE  (APRESOLINE ) 25 MG tablet Take 1 tablet (25 mg total) by mouth 4 (four) times daily. 05/20/18 06/10/24 Yes Konidena, Snehalatha, MD  meclizine (ANTIVERT) 25 MG tablet Take 1 tablet (25 mg total) by mouth 3 (three) times daily as needed. 06/10/24  Yes Ward, Josette SAILOR, DO  ondansetron  (ZOFRAN -ODT) 4 MG disintegrating tablet Take 1 tablet (4 mg total) by mouth every 6 (six) hours as needed for nausea or vomiting. 06/10/24  Yes Ward, Kristen N, DO  OZEMPIC, 1 MG/DOSE, 4 MG/3ML SOPN Inject 1 mg into the skin once a week. 05/22/24  Yes [provider]  pregabalin (LYRICA) 100  MG capsule Take 100 mg by mouth 2 (two) times daily.   Yes [provider]    Physical Exam: Vitals:   06/10/24 0100 06/10/24 0418 06/10/24 0639 06/10/24 0724  BP: 106/60 112/68 124/65 137/68  Pulse: 62 64 68 (!) 58  Resp:  18 16 18   Temp:  97.9 F (36.6 C) 97.9 F (36.6 C) 97.8 F (36.6 C)  TempSrc:  Oral    SpO2:  99% 100% 100%  Weight:      Height:        Physical Exam   Constitutional: Alert, awake, calm, comfortable HEENT: Neck supple Respiratory: Clear to auscultation B/L, no wheezing, no rales.  Cardiovascular: Regular rate and rhythm, no murmurs / rubs / gallops. No extremity edema. 2+ pedal pulses. No carotid bruits.  Abdomen: Soft, no tenderness, Bowel sounds positive.  Musculoskeletal: no clubbing / cyanosis. Good ROM, no contractures. Normal muscle tone.  Skin: no rashes, lesions, ulcers. Neurologic: CN 2-12 grossly intact. Sensation intact, No focal deficit identified Psychiatric: Alert and oriented x 3. Normal mood.    Labs on Admission: I have personally reviewed following labs and imaging studies  CBC: Recent Labs  Lab 06/09/24 1829  WBC 6.6  HGB 13.5  HCT 42.8  MCV 87.9  PLT 215   Basic Metabolic Panel: Recent Labs  Lab 06/09/24 1829  NA 137  K 3.3*  CL 103  CO2 21*  GLUCOSE 125*  BUN 14  CREATININE 0.81  CALCIUM 9.4  MG 1.9   GFR: Estimated Creatinine Clearance: 76 mL/min (by C-G formula based on SCr of 0.81 mg/dL). Liver Function Tests: No results for input(s): AST, ALT, ALKPHOS, BILITOT, PROT, ALBUMIN in the last 168 hours. No results for input(s): LIPASE, AMYLASE in the last 168 hours. No results for input(s): AMMONIA in the last 168 hours. Coagulation Profile: No results for input(s): INR, PROTIME in the last 168 hours. Cardiac Enzymes: Recent Labs  Lab 06/09/24 1829  TROPONINIHS 5   BNP (last 3 results) Recent Labs    07/01/23 1332  BNP 43.4   HbA1C: No results for input(s): HGBA1C in  the last 72 hours. CBG: No results for input(s): GLUCAP in the last 168 hours. Lipid Profile: No results for input(s): CHOL, HDL, LDLCALC, TRIG, CHOLHDL, LDLDIRECT in the last 72 hours. Thyroid  Function Tests: Recent Labs    06/09/24 1829  TSH 0.676   Anemia Panel: No results for input(s): VITAMINB12, FOLATE, FERRITIN, TIBC, IRON, RETICCTPCT in the last 72 hours. Urine analysis:    Component Value Date/Time   COLORURINE YELLOW (A) 06/09/2024 2332   APPEARANCEUR CLEAR (A) 06/09/2024 2332   APPEARANCEUR Hazy 10/01/2012 0251   LABSPEC 1.015 06/09/2024 2332   LABSPEC 1.014 10/01/2012 0251   PHURINE 6.0 06/09/2024 2332   GLUCOSEU NEGATIVE 06/09/2024 2332   GLUCOSEU Negative 10/01/2012 0251   HGBUR  NEGATIVE 06/09/2024 2332   BILIRUBINUR NEGATIVE 06/09/2024 2332   BILIRUBINUR Negative 10/01/2012 0251   KETONESUR NEGATIVE 06/09/2024 2332   PROTEINUR NEGATIVE 06/09/2024 2332   NITRITE NEGATIVE 06/09/2024 2332   LEUKOCYTESUR NEGATIVE 06/09/2024 2332   LEUKOCYTESUR 2+ 10/01/2012 0251    Radiological Exams on Admission: I have personally reviewed images MR BRAIN WO CONTRAST Result Date: 06/10/2024 EXAM: MRI BRAIN WITHOUT CONTRAST 06/10/2024 04:09:27 AM TECHNIQUE: Multiplanar multisequence MRI of the head/brain was performed without the administration of intravenous contrast. COMPARISON: Head CT yesterday, 06/09/2024. CLINICAL HISTORY: 68 year old female with persistent dizziness. FINDINGS: Susceptibility artifact around the left base and orbits of unclear etiology and significance, mildly degrades DWI and SWI on this exam. BRAIN AND VENTRICLES: No acute infarct. No intracranial hemorrhage. No mass. No midline shift. No hydrocephalus. Brain volume within normal limits for age. Largely normal for age gray and white matter signal throughout the brain. Minimal nonspecific white matter T2 and FLAIR hyperintensity. No cortical encephalomalacia. No chronic cerebral blood  products. The sella is unremarkable. Normal flow voids. ORBITS: No acute abnormality. SINUSES AND MASTOIDS: Mastoids are clear. Grossly normal visible internal auditory structures. No acute abnormality. BONES AND SOFT TISSUES: marrow signal Within normal limits Negative for age visible cervical spine bone marrow signal is within normal limits. Susceptibility artifact along the anterior and left face (relevant to soft tissues). IMPRESSION: 1. No acute intracranial abnormality. Normal for age non-contrast MRI appearance of the brain. 2. Unknown Susceptibility artifact around the left face and orbits, mildly degrading exam. Electronically signed by: Helayne Hurst MD 06/10/2024 04:19 AM EST RP Workstation: [id number]   US  Venous Img Lower Unilateral Right Result Date: 06/10/2024 EXAM: ULTRASOUND DUPLEX OF THE RIGHT LOWER EXTREMITY VEINS TECHNIQUE: Duplex ultrasound using B-mode/gray scaled imaging and Doppler spectral analysis and color flow was obtained of the deep venous structures of the right lower extremity. COMPARISON: None available. CLINICAL HISTORY: Pain in popliteal fossa. FINDINGS: The common femoral vein, femoral vein, popliteal vein, and posterior tibial vein of the right lower extremity demonstrate normal compressibility with normal color flow and spectral analysis. IMPRESSION: 1. No evidence of DVT. Electronically signed by: Norman Gatlin MD 06/10/2024 12:48 AM EST RP Workstation: [id number]   CT HEAD WO CONTRAST ( ) Result Date: 06/09/2024 CLINICAL DATA:  Dizziness EXAM: CT HEAD WITHOUT CONTRAST TECHNIQUE: Contiguous axial images were obtained from the base of the skull through the vertex without intravenous contrast. RADIATION DOSE REDUCTION: This exam was performed according to the departmental dose-optimization program which includes automated exposure control, adjustment of the mA and/or kV according to patient size and/or use of iterative reconstruction technique. COMPARISON:  None Available.  FINDINGS: Brain: No acute territorial infarction, hemorrhage or intracranial mass. The ventricles are nonenlarged. Vascular: No hyperdense vessels.  Carotid vascular calcification Skull: Normal. Negative for fracture or focal lesion. Sinuses/Orbits: No acute finding. Other: None IMPRESSION: Negative non contrasted CT appearance of the brain. Electronically Signed   By: Luke Bun M.D.   On: 06/09/2024 21:20    EKG: My personal interpretation of EKG shows: Normal sinus rhythm at 59 bpm, no ST elevation    Assessment/Plan Principal Problem:   Vertigo Active Problems:   HTN (hypertension)    Assessment and Plan: 68 year old female with history of hypertension presented with vertigo for a week duration.  1.  BPPV - Patient was given meclizine, dexamethasone , IV fluid without relief. - MRI of the brain is negative for acute pathology - She will be placed in observation - Will give her meclizine  and watch her symptoms. - PT/OT  2.  HTN - Stable - Continue to monitor blood pressure - Resume home medication  3.  Hypokalemia - Potassium replaced - Will check potassium level in the morning   DVT prophylaxis: Lovenox  Code Status: Full Code Family Communication: None Disposition Plan: Home Consults called: None Admission status: Observation, Med-Surg   Nena Rebel, MD [facility] 06/10/2024, 9:05 AM

## 2024-06-10 NOTE — Progress Notes (Signed)
 Anticoagulation monitoring(Lovenox ):  68 yo female ordered Lovenox  40 mg Q24h    Filed Weights   06/09/24 1829  Weight: 99.8 kg (220 lb)   BMI 39   Lab Results  Component Value Date   CREATININE 0.81 06/09/2024   CREATININE 1.01 (H) 07/01/2023   CREATININE 0.74 05/03/2019   Estimated Creatinine Clearance: 76 mL/min (by C-G formula based on SCr of 0.81 mg/dL). Hemoglobin & Hematocrit     Component Value Date/Time   HGB 13.5 06/09/2024 1829   HGB 12.9 02/16/2013 1154   HCT 42.8 06/09/2024 1829   HCT 38.9 02/16/2013 1154     Per Protocol for Patient with estCrcl > 30 ml/min and BMI > 30, will transition to Lovenox  50 mg Q24h.

## 2024-06-10 NOTE — ED Notes (Signed)
 Attempted to ambulate pt, still reports her head spinning and unable to maintain her balance.  Provider notified.

## 2024-06-10 NOTE — ED Notes (Signed)
 Attempted to ambulate pt, unable to get more than a few feet from the stretcher.  Pt gait very unsteady, states she feels everything pulling her to the left.  Pt returned to stretcher and provider notified.

## 2024-06-10 NOTE — Evaluation (Signed)
 Occupational Therapy Evaluation Patient Details Name: Katie Leon MRN: [id number] DOB: 1955-10-11 Today's Date: 06/10/2024   History of Present Illness   Katie Leon is a pleasant 68 y.o. female with medical history significant for HTN who presented to ED complaining of dizziness that has been ongoing since Tuesday, 06/06/2024.  She describes it as feeling like the room is spinning that is worse with turning her head and standing.  She denied similar history in the past.  She denies any history of head injury.  She denies any fever, headache, tingling, numbness.  She denies any history of prior stroke.     Clinical Impressions Pt seen this date for OT evaluation.  Pt able to demonstrate bed mobility, sit to stand transfer with modified independence.  She is able to complete lower body dressing with modified independence.  She does have some dizziness at times but did well during session moving slowly.  She denies need for OT at this time.  Recommend she have assistance at home for homemaking tasks and transportation until her symptoms of dizziness improve.  Recommend supervision for safety with ambulating to and from the bathroom for safety while she is in the hospital.  She reports she has multiple family members nearby to assist as needed when she returns home.  She does not have any skilled OT needs at this time. Please re consult if needs arise.      If plan is discharge home, recommend the following:   Assist for transportation;Assistance with cooking/housework     Functional Status Assessment   Patient has had a recent decline in their functional status and demonstrates the ability to make significant improvements in function in a reasonable and predictable amount of time.     Equipment Recommendations         Recommendations for Other Services         Precautions/Restrictions   Precautions Precautions: None Restrictions Weight Bearing Restrictions Per Provider  Order: No     Mobility Bed Mobility   Bed Mobility: Rolling, Sidelying to Sit Rolling: Modified independent (Device/Increase time) Sidelying to sit: Modified independent (Device/Increase time)            Transfers   Equipment used: Rolling walker (2 wheels) Transfers: Sit to/from Stand Sit to Stand: Modified independent (Device/Increase time)                  Balance Overall balance assessment: Needs assistance Sitting-balance support: Feet unsupported Sitting balance-Leahy Scale: Fair                                     ADL either performed or assessed with clinical judgement   ADL Overall ADL's : At baseline                                       General ADL Comments: Pt able to demonstrate lower body dressing independently, toileting with modified independence.  No apparent skilled needs for OT.     Vision Baseline Vision/History: 0 No visual deficits       Perception         Praxis         Pertinent Vitals/Pain Pain Assessment Pain Assessment: 0-10 Pain Score: 4  Pain Location: right leg Pain Descriptors / Indicators: Aching, Sore  Extremity/Trunk Assessment Upper Extremity Assessment Upper Extremity Assessment: Overall WFL for tasks assessed   Lower Extremity Assessment Lower Extremity Assessment: Defer to PT evaluation   Cervical / Trunk Assessment Cervical / Trunk Assessment: Kyphotic   Communication Communication Communication: No apparent difficulties   Cognition Arousal: Alert Behavior During Therapy: WFL for tasks assessed/performed Cognition: No apparent impairments                               Following commands: Intact       Cueing  General Comments          Exercises     Shoulder Instructions      Home Living Family/patient expects to be discharged to:: Private residence Living Arrangements: Alone;Children Available Help at Discharge: Family Type of Home:  Independent living facility Home Access: Elevator     Home Layout: Able to live on main level with bedroom/bathroom     Bathroom Shower/Tub: Producer, Television/film/video: Handicapped height Bathroom Accessibility: Yes   Home Equipment: None          Prior Functioning/Environment Prior Level of Function : Independent/Modified Independent             Mobility Comments: independent for all mobility and transfers ADLs Comments: independent for all ADLs    OT Problem List: Impaired balance (sitting and/or standing)   OT Treatment/Interventions:        OT Goals(Current goals can be found in the care plan section)   Acute Rehab OT Goals Patient Stated Goal: to go back home and live independently OT Goal Formulation: With patient Time For Goal Achievement: 06/24/24 Potential to Achieve Goals: Good   OT Frequency:       Co-evaluation              AM-PAC OT 6 Clicks Daily Activity     Outcome Measure Help from another person eating meals?: None Help from another person taking care of personal grooming?: None Help from another person toileting, which includes using toliet, bedpan, or urinal?: None Help from another person bathing (including washing, rinsing, drying)?: None Help from another person to put on and taking off regular upper body clothing?: None Help from another person to put on and taking off regular lower body clothing?: None 6 Click Score: 24   End of Session    Activity Tolerance: Patient tolerated treatment well Patient left: in bed;with bed alarm set  OT Visit Diagnosis: Unsteadiness on feet (R26.81);Pain                Time: 8472-8456 OT Time Calculation (min): 16 min Charges:  OT General Charges $OT Visit: 1 Visit OT Evaluation $OT Eval Low Complexity: 1 Low  Thatcher Doberstein T Jedediah Noda, OTR/L, CLT  Ramiah Helfrich 06/10/2024, 3:59 PM

## 2024-06-11 DIAGNOSIS — R42 Dizziness and giddiness: Secondary | ICD-10-CM | POA: Diagnosis not present

## 2024-06-11 LAB — BASIC METABOLIC PANEL WITH GFR
Anion gap: 5 (ref 5–15)
BUN: 17 mg/dL (ref 8–23)
CO2: 23 mmol/L (ref 22–32)
Calcium: 8.5 mg/dL — ABNORMAL LOW (ref 8.9–10.3)
Chloride: 113 mmol/L — ABNORMAL HIGH (ref 98–111)
Creatinine, Ser: 0.59 mg/dL (ref 0.44–1.00)
GFR, Estimated: >60 mL/min (ref >60)
Glucose, Bld: 90 mg/dL (ref 70–99)
Potassium: 2.8 mmol/L — ABNORMAL LOW (ref 3.5–5.1)
Sodium: 141 mmol/L (ref 135–145)

## 2024-06-11 LAB — MAGNESIUM: Magnesium: 1.8 mg/dL (ref 1.7–2.4)

## 2024-06-11 MED ORDER — POTASSIUM CHLORIDE CRYS ER 20 MEQ PO TBCR
40.0000 meq | EXTENDED_RELEASE_TABLET | Freq: Two times a day (BID) | ORAL | Status: DC
  Administered 2024-06-11: 40 meq via ORAL
  Filled 2024-06-11: qty 2

## 2024-06-11 NOTE — Discharge Summary (Signed)
 Physician Discharge Summary  Katie Leon FMW:[id number] DOB: 1956/04/17 DOA: 06/09/2024  PCP: Vicci Mliss SAUNDERS, FNP  Admit date: 06/09/2024 Discharge date: 06/11/2024  Admitted From: home Disposition:  home  Recommendations for Outpatient Follow-up:  Follow up with PCP in 1-2 weeks   Home Health: no, pt refused HH  Equipment/Devices:  Discharge Condition: stable  CODE STATUS: full  Diet recommendation: Heart Healthy   Brief/Interim Summary: HPI was taken from Dr. Roann: Katie Leon is a pleasant 68 y.o. female with medical history significant for HTN who presented to ED complaining of dizziness that has been ongoing since Tuesday, 06/06/2024.  She describes it as feeling like the room is spinning that is worse with turning her head and standing.  She denied similar history in the past.  She denies any history of head injury.  She denies any fever, headache, tingling, numbness.  She denies any history of prior stroke.   ED Course: Upon arrival to the ED, patient is found to be hypertensive around 148/86 potassium 3.3 EKG showed sinus rhythm at 58 bpm MRI of the brain showed no acute intracranial abnormality, right lower extremity Doppler showed no evidence of DVT.  Patient was given multiple doses of meclizine, dexamethasone , IV fluid potassium, diazepam , Zofran  still patient's symptoms did not improve.  Hospitalist service was consulted for evaluation for admission for intractable vertigo.  Discharge Diagnoses:  Principal Problem:   Vertigo Active Problems:   HTN (hypertension)  BPPV: continue on meclizine and zofran  prn. Resolved. MRI brain shows no acute intracranial abnormalities. PT/OT recs HH  HTN: continue on home dose of amlodipine, coreg, hydralazine , lasix .   Hypokalemia: potassium given  Obesity: BMI 38.9. Would benefit from weight loss    Discharge Instructions  Discharge Instructions     Diet - low sodium heart healthy   Complete by: As directed     Discharge instructions   Complete by: As directed    F/u w/ PCP in 1-2 weeks   Increase activity slowly   Complete by: As directed       Allergies as of 06/11/2024   No Known Allergies      Medication List     TAKE these medications    amLODipine 10 MG tablet Commonly known as: NORVASC Take 10 mg by mouth daily.   carvedilol 6.25 MG tablet Commonly known as: COREG Take 6.25 mg by mouth 2 (two) times daily with a meal.   colchicine 0.6 MG tablet Take 0.6 mg by mouth daily.   esomeprazole 40 MG capsule Commonly known as: NEXIUM Take 40 mg by mouth 2 (two) times daily before a meal.   furosemide  20 MG tablet Commonly known as: LASIX  Take 20 mg by mouth daily.   hydrALAZINE  25 MG tablet Commonly known as: APRESOLINE  Take 1 tablet (25 mg total) by mouth 4 (four) times daily.   meclizine 25 MG tablet Commonly known as: ANTIVERT Take 1 tablet (25 mg total) by mouth 3 (three) times daily as needed.   ondansetron  4 MG disintegrating tablet Commonly known as: ZOFRAN -ODT Take 1 tablet (4 mg total) by mouth every 6 (six) hours as needed for nausea or vomiting.   Ozempic (1 MG/DOSE) 4 MG/3ML Sopn Generic drug: Semaglutide (1 MG/DOSE) Inject 1 mg into the skin once a week.   pregabalin 100 MG capsule Commonly known as: LYRICA Take 100 mg by mouth 2 (two) times daily.        Follow-up Information     Milissa Hamming, MD.  Schedule an appointment as soon as possible for a visit .   Specialty: Otolaryngology Why: If symptoms worsen or do not improve in one week Contact information: [address] [place] [address] [phone]         Vicci Mliss SAUNDERS, FNP Follow up.   Specialty: Family Medicine Why: hospital follow up Contact information: [address] [place] [address] [phone]                No Known Allergies  Consultations:    Procedures/Studies: MR BRAIN WO CONTRAST Result Date:  06/10/2024 EXAM: MRI BRAIN WITHOUT CONTRAST 06/10/2024 04:09:27 AM TECHNIQUE: Multiplanar multisequence MRI of the head/brain was performed without the administration of intravenous contrast. COMPARISON: Head CT yesterday, 06/09/2024. CLINICAL HISTORY: 68 year old female with persistent dizziness. FINDINGS: Susceptibility artifact around the left base and orbits of unclear etiology and significance, mildly degrades DWI and SWI on this exam. BRAIN AND VENTRICLES: No acute infarct. No intracranial hemorrhage. No mass. No midline shift. No hydrocephalus. Brain volume within normal limits for age. Largely normal for age gray and white matter signal throughout the brain. Minimal nonspecific white matter T2 and FLAIR hyperintensity. No cortical encephalomalacia. No chronic cerebral blood products. The sella is unremarkable. Normal flow voids. ORBITS: No acute abnormality. SINUSES AND MASTOIDS: Mastoids are clear. Grossly normal visible internal auditory structures. No acute abnormality. BONES AND SOFT TISSUES: marrow signal Within normal limits Negative for age visible cervical spine bone marrow signal is within normal limits. Susceptibility artifact along the anterior and left face (relevant to soft tissues). IMPRESSION: 1. No acute intracranial abnormality. Normal for age non-contrast MRI appearance of the brain. 2. Unknown Susceptibility artifact around the left face and orbits, mildly degrading exam. Electronically signed by: Helayne Hurst MD 06/10/2024 04:19 AM EST RP Workstation: [id number]   US  Venous Img Lower Unilateral Right Result Date: 06/10/2024 EXAM: ULTRASOUND DUPLEX OF THE RIGHT LOWER EXTREMITY VEINS TECHNIQUE: Duplex ultrasound using B-mode/gray scaled imaging and Doppler spectral analysis and color flow was obtained of the deep venous structures of the right lower extremity. COMPARISON: None available. CLINICAL HISTORY: Pain in popliteal fossa. FINDINGS: The common femoral vein, femoral vein, popliteal  vein, and posterior tibial vein of the right lower extremity demonstrate normal compressibility with normal color flow and spectral analysis. IMPRESSION: 1. No evidence of DVT. Electronically signed by: Norman Gatlin MD 06/10/2024 12:48 AM EST RP Workstation: [id number]   CT HEAD WO CONTRAST ( ) Result Date: 06/09/2024 CLINICAL DATA:  Dizziness EXAM: CT HEAD WITHOUT CONTRAST TECHNIQUE: Contiguous axial images were obtained from the base of the skull through the vertex without intravenous contrast. RADIATION DOSE REDUCTION: This exam was performed according to the departmental dose-optimization program which includes automated exposure control, adjustment of the mA and/or kV according to patient size and/or use of iterative reconstruction technique. COMPARISON:  None Available. FINDINGS: Brain: No acute territorial infarction, hemorrhage or intracranial mass. The ventricles are nonenlarged. Vascular: No hyperdense vessels.  Carotid vascular calcification Skull: Normal. Negative for fracture or focal lesion. Sinuses/Orbits: No acute finding. Other: None IMPRESSION: Negative non contrasted CT appearance of the brain. Electronically Signed   By: Luke Bun M.D.   On: 06/09/2024 21:20   (Echo, Carotid, EGD, Colonoscopy, ERCP)    Subjective: Pt denies any vertigo.   Discharge Exam: Vitals:   06/11/24 0505 06/11/24 0913  BP: 131/66 (!) 150/65  Pulse: 64 66  Resp: 16 16  Temp: 97.8 F (36.6 C) 98.2 F (36.8 C)  SpO2: 99% 96%   Vitals:   06/10/24 1557 06/10/24 1957 06/11/24 0505 06/11/24 0913  BP: 131/76 112/67 131/66 (!) 150/65  Pulse: 64 (!) 57 64 66  Resp: 18 16 16 16   Temp: 98.2 F (36.8 C) 98.2 F (36.8 C) 97.8 F (36.6 C) 98.2 F (36.8 C)  TempSrc: Oral     SpO2: 98% 98% 99% 96%  Weight:      Height:        General: Pt is alert, awake, not in acute distress Cardiovascular: S1/S2 +, no rubs, no gallops Respiratory: CTA bilaterally, no wheezing, no rhonchi Abdominal: Soft,  NT, obese, bowel sounds + Extremities: no cyanosis    The results of significant diagnostics from this hospitalization (including imaging, microbiology, ancillary and laboratory) are listed below for reference.     Microbiology: No results found for this or any previous visit (from the past 240 hours).   Labs: BNP (last 3 results) Recent Labs    07/01/23 1332  BNP 43.4   Basic Metabolic Panel: Recent Labs  Lab 06/09/24 1829 06/11/24 0442  NA 137 141  K 3.3* 2.8*  CL 103 113*  CO2 21* 23  GLUCOSE 125* 90  BUN 14 17  CREATININE 0.81 0.59  CALCIUM 9.4 8.5*  MG 1.9 1.8   Liver Function Tests: No results for input(s): AST, ALT, ALKPHOS, BILITOT, PROT, ALBUMIN in the last 168 hours. No results for input(s): LIPASE, AMYLASE in the last 168 hours. No results for input(s): AMMONIA in the last 168 hours. CBC: Recent Labs  Lab 06/09/24 1829  WBC 6.6  HGB 13.5  HCT 42.8  MCV 87.9  PLT 215   Cardiac Enzymes: No results for input(s): CKTOTAL, CKMB, CKMBINDEX, TROPONINI in the last 168 hours. BNP: Invalid input(s): POCBNP CBG: No results for input(s): GLUCAP in the last 168 hours. D-Dimer No results for input(s): DDIMER in the last 72 hours. Hgb A1c No results for input(s): HGBA1C in the last 72 hours. Lipid Profile No results for input(s): CHOL, HDL, LDLCALC, TRIG, CHOLHDL, LDLDIRECT in the last 72 hours. Thyroid  function studies Recent Labs    06/09/24 1829  TSH 0.676   Anemia work up No results for input(s): VITAMINB12, FOLATE, FERRITIN, TIBC, IRON, RETICCTPCT in the last 72 hours. Urinalysis    Component Value Date/Time   COLORURINE YELLOW (A) 06/09/2024 2332   APPEARANCEUR CLEAR (A) 06/09/2024 2332   APPEARANCEUR Hazy 10/01/2012 0251   LABSPEC 1.015 06/09/2024 2332   LABSPEC 1.014 10/01/2012 0251   PHURINE 6.0 06/09/2024 2332   GLUCOSEU NEGATIVE 06/09/2024 2332   GLUCOSEU Negative 10/01/2012  0251   HGBUR NEGATIVE 06/09/2024 2332   BILIRUBINUR NEGATIVE 06/09/2024 2332   BILIRUBINUR Negative 10/01/2012 0251   KETONESUR NEGATIVE 06/09/2024 2332   PROTEINUR NEGATIVE 06/09/2024 2332   NITRITE NEGATIVE 06/09/2024 2332   LEUKOCYTESUR NEGATIVE 06/09/2024 2332   LEUKOCYTESUR 2+ 10/01/2012 0251   Sepsis Labs Recent Labs  Lab 06/09/24 1829  WBC 6.6   Microbiology No results found for this or any previous visit (from the past 240 hours).   Time coordinating discharge: 34 minutes  SIGNED:   Anthony CHRISTELLA Pouch, MD  [facility] 06/11/2024, 10:05 AM Pager   If 7PM-7AM, please contact night-coverage [email or web address]

## 2024-06-11 NOTE — Plan of Care (Signed)
